# Patient Record
Sex: Female | Born: 1990 | Race: Black or African American | Hispanic: No | Marital: Single | Smoking: Former smoker
Health system: Southern US, Community
[De-identification: ages and names within clinical notes are randomized; demographics above are authoritative.]

## PROBLEM LIST (undated history)

## (undated) DIAGNOSIS — F419 Anxiety disorder, unspecified: Secondary | ICD-10-CM

## (undated) DIAGNOSIS — J45909 Unspecified asthma, uncomplicated: Secondary | ICD-10-CM

## (undated) DIAGNOSIS — O24419 Gestational diabetes mellitus in pregnancy, unspecified control: Secondary | ICD-10-CM

## (undated) DIAGNOSIS — F329 Major depressive disorder, single episode, unspecified: Secondary | ICD-10-CM

## (undated) DIAGNOSIS — O1490 Unspecified pre-eclampsia, unspecified trimester: Secondary | ICD-10-CM

## (undated) DIAGNOSIS — A5901 Trichomonal vulvovaginitis: Secondary | ICD-10-CM

## (undated) DIAGNOSIS — E669 Obesity, unspecified: Secondary | ICD-10-CM

## (undated) DIAGNOSIS — F32A Depression, unspecified: Secondary | ICD-10-CM

## (undated) HISTORY — DX: Trichomonal vulvovaginitis: A59.01

## (undated) HISTORY — DX: Gestational diabetes mellitus in pregnancy, unspecified control: O24.419

## (undated) HISTORY — PX: TONSILLECTOMY: SUR1361

---
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
## 2010-02-18 ENCOUNTER — Emergency Department (HOSPITAL_COMMUNITY): Admission: EM | Admit: 2010-02-18 | Discharge: 2010-02-18 | Payer: Self-pay | Admitting: Emergency Medicine

## 2010-04-04 ENCOUNTER — Emergency Department (HOSPITAL_COMMUNITY): Admission: EM | Admit: 2010-04-04 | Discharge: 2010-04-04 | Payer: Self-pay | Admitting: Emergency Medicine
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
## 2010-10-29 NOTE — Group Therapy Note (Signed)
Brooke Robertson, SALMINEN NO.:  [id number]   MEDICAL RECORD NO.:  [id number]          PATIENT TYPE:  INP   LOCATION:  0102                          FACILITY:  [facility]   PHYSICIAN:  Lalla Brothers, MDDATE OF BIRTH:  02-12-1991                                 PROGRESS NOTE   SERVICE CODE:  16109 of 25 minutes.   PRESENT ILLNESS:  The patient remains depressed and anxious, appearing to underlie her  resistant self-defeat to the overall treatment process.  Father is  visiting episodically and encouraging the patient to engage in a  trusting and therapeutic fashion.  Patient will state that she did not  sleep well last night and she feels dizzy.  She denies chest pain or  difficulty breathing today.  She has not had any overt panic attacks,  requiring staff intervention, with p.r.n. Ativan and she did not take  Ativan at bedtime to help her sleep.  In fact, she tells me not to give  her Ativan, but she has a hospital-dependent style, as she did about  starting the Zoloft that prompts to go ahead and set up bedtime Ativan  on a scheduled basis, even though the patient tells me not to.  No  definite injury from stabbing her chest with a screwdriver has evolved  and I cannot determine that the patient has additional self-injury since  admission.  Patient indicates to staff at times that she has no reason  to be here, instead of addressing her suicide risk and problems.  She  does allow me to explore the family structure in anticipated return to  step-mother's home when step-mother is not available.   MENTAL STATUS EXAM:  Blood pressure is 110/67 with heart rate of 66 supine and 110/72 with  heart rate of 110 standing.  Patient has no sore throat complaints now  and her strep screen was negative.  Patient has no abnormal involuntary  movements.  She has moderate anxiety and moderate to severe dysphoria.  However, she has titrated up to 1 mg/kg of Zoloft.  She has no overt  psychosis and no manic diathesis.  She is tense and inhibited, as well  as avoidant.   TARGET DIAGNOSES:  1. Major depression, single episode, severe.  2. Panic disorder, likely with agoraphobia.   PLAN:  Ativan is ordered at 0.5 mg q.h.s., as well as being available every 4  hours p.r.n. for panic.  She continues Zoloft 50 mg every morning.  Medication education is updated and psychotherapy is carried out and  integrated with the treatment milieu and groups.      Lalla Brothers, MD  Electronically Signed     GEJ/MEDQ  D:  11/12/2007  T:  11/12/2007  Job:  [id number]
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
## 2011-03-12 LAB — ETHANOL: Alcohol, Ethyl (B): <5

## 2011-03-12 LAB — GC/CHLAMYDIA PROBE AMP, URINE
Chlamydia, Swab/Urine, PCR: NEGATIVE
GC Probe Amp, Urine: NEGATIVE

## 2011-03-12 LAB — GAMMA GT: GGT: 25

## 2011-03-12 LAB — HEPATIC FUNCTION PANEL
Albumin: 4
Alkaline Phosphatase: 55
Indirect Bilirubin: 0.5
Total Protein: 7.5

## 2011-03-12 LAB — URINALYSIS, ROUTINE W REFLEX MICROSCOPIC
Glucose, UA: NEGATIVE
Ketones, ur: NEGATIVE
Nitrite: NEGATIVE
Specific Gravity, Urine: 1.025
pH: 7.5

## 2011-03-12 LAB — DIFFERENTIAL
Eosinophils Absolute: 0.2
Lymphocytes Relative: 40
Lymphs Abs: 2.4
Monocytes Relative: 7
Neutro Abs: 3.1
Neutrophils Relative %: 50

## 2011-03-12 LAB — POCT I-STAT, CHEM 8
BUN: 11
Calcium, Ion: 1.17
Chloride: 111
Creatinine, Ser: 0.9
Sodium: 144

## 2011-03-12 LAB — RAPID URINE DRUG SCREEN, HOSP PERFORMED
Cocaine: NOT DETECTED
Opiates: NOT DETECTED
Tetrahydrocannabinol: NOT DETECTED

## 2011-03-12 LAB — PREGNANCY, URINE: Preg Test, Ur: NEGATIVE

## 2011-03-12 LAB — RPR: RPR Ser Ql: NONREACTIVE

## 2011-03-12 LAB — CBC
Platelets: 275
RBC: 4.52
WBC: 6.1

## 2011-03-12 LAB — POCT PREGNANCY, URINE
Operator id: 196461
Preg Test, Ur: NEGATIVE

## 2011-03-12 LAB — RAPID STREP SCREEN (MED CTR MEBANE ONLY): Streptococcus, Group A Screen (Direct): NEGATIVE

## 2011-03-12 LAB — T4, FREE: Free T4: 0.98

## 2011-03-27 LAB — POCT RAPID STREP A: Streptococcus, Group A Screen (Direct): POSITIVE — AB

## 2011-12-16 ENCOUNTER — Encounter (HOSPITAL_BASED_OUTPATIENT_CLINIC_OR_DEPARTMENT_OTHER): Payer: Self-pay

## 2011-12-16 ENCOUNTER — Emergency Department (HOSPITAL_BASED_OUTPATIENT_CLINIC_OR_DEPARTMENT_OTHER): Payer: Self-pay

## 2011-12-16 ENCOUNTER — Emergency Department (HOSPITAL_BASED_OUTPATIENT_CLINIC_OR_DEPARTMENT_OTHER)
Admission: EM | Admit: 2011-12-16 | Discharge: 2011-12-16 | Disposition: A | Discharge status: Home / Self Care | Payer: Self-pay | Attending: Emergency Medicine | Admitting: Emergency Medicine

## 2011-12-16 DIAGNOSIS — R0609 Other forms of dyspnea: Secondary | ICD-10-CM | POA: Insufficient documentation

## 2011-12-16 DIAGNOSIS — R0989 Other specified symptoms and signs involving the circulatory and respiratory systems: Secondary | ICD-10-CM | POA: Insufficient documentation

## 2011-12-16 DIAGNOSIS — H1045 Other chronic allergic conjunctivitis: Secondary | ICD-10-CM | POA: Insufficient documentation

## 2011-12-16 DIAGNOSIS — R0600 Dyspnea, unspecified: Secondary | ICD-10-CM

## 2011-12-16 DIAGNOSIS — H1010 Acute atopic conjunctivitis, unspecified eye: Secondary | ICD-10-CM

## 2011-12-16 DIAGNOSIS — R51 Headache: Secondary | ICD-10-CM | POA: Insufficient documentation

## 2011-12-16 LAB — PREGNANCY, URINE: Preg Test, Ur: NEGATIVE

## 2011-12-16 MED ORDER — KETOTIFEN FUMARATE 0.025 % OP SOLN: 1.0000 [drp] | Freq: Two times a day (BID) | OPHTHALMIC | Status: AC

## 2011-12-16 NOTE — ED Notes (Signed)
Pt requesting preg test, denies abd pain , nausea or vaginal bleeding

## 2011-12-16 NOTE — ED Notes (Signed)
C/o HA, sob, "eyes blood shot" x 3-4 days

## 2011-12-16 NOTE — ED Provider Notes (Signed)
History     CSN: [id number]  Arrival date & time 12/16/11  1723   First MD Initiated Contact with Patient 12/16/11 1854      Chief Complaint  Patient presents with  . Headache    (Consider location/radiation/quality/duration/timing/severity/associated sxs/prior treatment) HPI This 21 year old female is PERC negative and complains of a few days of some mild shortness of breath without cough or chest pain or fever. She also is no trauma. She woke up today with a mild to moderate headache is global and without associated symptoms. She has some slightly itchy eyes with some slightly bloodshot eyes today but no purulent drainage in her eyes and no pain in her eyes. There is no change in vision. She is no change in speech vision swallowing or understanding. She also is no lateralizing or focal weakness numbness or incoordination. She is no abdominal pain vomiting diarrhea dysuria or vaginal bleeding or vaginal discharge. She however states she wants a pregnancy test because she had a regular menstrual bleeding a couple weeks ago chest 2 weeks after having her normal cycle a month ago. History reviewed. No pertinent past medical history.  Past Surgical History  Procedure Date  . Tonsillectomy     No family history on file.  History  Substance Use Topics  . Smoking status: Current Some Day Smoker  . Smokeless tobacco: Not on file  . Alcohol Use: Yes    OB History    Grav Para Term Preterm Abortions TAB SAB Ect Mult Living                  Review of Systems  Constitutional: Negative for fever.       10 Systems reviewed and are negative for acute change except as noted in the HPI.  HENT: Negative for congestion.   Eyes: Negative for discharge and redness.  Respiratory: Positive for shortness of breath. Negative for cough and stridor.   Cardiovascular: Negative for chest pain.  Gastrointestinal: Negative for vomiting and abdominal pain.  Musculoskeletal: Negative for back pain.    Skin: Negative for rash.  Neurological: Positive for headaches. Negative for dizziness, syncope, facial asymmetry, speech difficulty, weakness, light-headedness and numbness.  Psychiatric/Behavioral:       No behavior change.    Allergies  Review of patient's allergies indicates no known allergies.  Home Medications   Current Outpatient Rx  Name Route Sig Dispense Refill  . IBUPROFEN 200 MG PO TABS Oral Take 400 mg by mouth every 6 (six) hours as needed. Patient used this medication for pain.    Marland Kitchen KETOTIFEN FUMARATE 0.025 % OP SOLN Both Eyes Place 1 drop into both eyes 2 (two) times daily. 5 mL 0    BP 121/79  Pulse 70  Temp 98.5 F (36.9 C) (Oral)  Resp 16  Ht 5' (1.524 m)  Wt 178 lb (80.74 kg)  BMI 34.76 kg/m2  SpO2 100%  LMP 12/10/2011  Physical Exam  Nursing note and vitals reviewed. Constitutional:       Awake, alert, nontoxic appearance with baseline speech for patient.  HENT:  Head: Atraumatic.  Mouth/Throat: Oropharynx is clear and moist. No oropharyngeal exudate.  Eyes: EOM are normal. Pupils are equal, round, and reactive to light. Right eye exhibits no discharge. Left eye exhibits no discharge. No scleral icterus.       Minimally injected conjunctiva bilaterally with no discharge  Neck: Neck supple.  Cardiovascular: Normal rate and regular rhythm.   No murmur heard. Pulmonary/Chest: Effort normal  and breath sounds normal. No stridor. No respiratory distress. She has no wheezes. She has no rales. She exhibits no tenderness.  Abdominal: Soft. Bowel sounds are normal. She exhibits no mass. There is no tenderness. There is no rebound.  Musculoskeletal: She exhibits no tenderness.       Baseline ROM, moves extremities with no obvious new focal weakness.  Lymphadenopathy:    She has no cervical adenopathy.  Neurological: She is alert.       Awake, alert, cooperative and aware of situation; motor strength bilaterally; sensation normal to light touch bilaterally;  peripheral visual fields full to confrontation; no facial asymmetry; tongue midline; major cranial nerves appear intact; no pronator drift, normal finger to nose bilaterally, baseline gait without new ataxia.  Skin: No rash noted.  Psychiatric: She has a normal mood and affect.    ED Course  Procedures (including critical care time)   Labs Reviewed  PREGNANCY, URINE   No results found.   1. Headache   2. Allergic conjunctivitis   3. Dyspnea       MDM  Pt stable in ED with no significant deterioration in condition.Patient / Family / Caregiver informed of clinical course, understand medical decision-making process, and agree with plan. I doubt any other EMC precluding discharge at this time including, but not necessarily limited to the following:SAH, meningitis, CVA, PE.        Hurman Horn, MD 12/20/11 1332

## 2011-12-16 NOTE — ED Notes (Signed)
Patient transported to X-ray 

## 2011-12-16 NOTE — ED Notes (Signed)
Returned from xray

## 2011-12-16 NOTE — ED Notes (Signed)
MD at bedside. 

## 2012-03-08 ENCOUNTER — Encounter (HOSPITAL_COMMUNITY): Payer: Self-pay | Admitting: *Deleted

## 2012-03-08 ENCOUNTER — Emergency Department (HOSPITAL_COMMUNITY)
Admission: EM | Admit: 2012-03-08 | Discharge: 2012-03-08 | Disposition: A | Discharge status: Home / Self Care | Payer: No Typology Code available for payment source | Attending: Emergency Medicine | Admitting: Emergency Medicine

## 2012-03-08 ENCOUNTER — Emergency Department (HOSPITAL_COMMUNITY): Payer: Self-pay

## 2012-03-08 DIAGNOSIS — M545 Low back pain, unspecified: Secondary | ICD-10-CM | POA: Insufficient documentation

## 2012-03-08 DIAGNOSIS — M542 Cervicalgia: Secondary | ICD-10-CM | POA: Insufficient documentation

## 2012-03-08 LAB — POCT PREGNANCY, URINE: Preg Test, Ur: NEGATIVE

## 2012-03-08 MED ORDER — IBUPROFEN 600 MG PO TABS: 600.0000 mg | ORAL_TABLET | Freq: Three times a day (TID) | ORAL | Status: DC | PRN

## 2012-03-08 MED ORDER — IBUPROFEN 200 MG PO TABS
600.0000 mg | ORAL_TABLET | Freq: Once | ORAL | Status: AC
  Administered 2012-03-08: 600 mg via ORAL
  Filled 2012-03-08: qty 3

## 2012-03-08 NOTE — ED Notes (Signed)
Xray informed of pt's negative preg test.
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
## 2012-03-08 NOTE — ED Notes (Signed)
Pt returned from X-ray.  

## 2012-06-21 ENCOUNTER — Encounter (HOSPITAL_COMMUNITY): Payer: Self-pay | Admitting: *Deleted

## 2012-06-21 ENCOUNTER — Emergency Department (HOSPITAL_COMMUNITY)
Admission: EM | Admit: 2012-06-21 | Discharge: 2012-06-21 | Disposition: A | Discharge status: Home / Self Care | Payer: Medicaid Other | Attending: Emergency Medicine | Admitting: Emergency Medicine

## 2012-06-21 DIAGNOSIS — F17200 Nicotine dependence, unspecified, uncomplicated: Secondary | ICD-10-CM | POA: Insufficient documentation

## 2012-06-21 DIAGNOSIS — Z8659 Personal history of other mental and behavioral disorders: Secondary | ICD-10-CM | POA: Insufficient documentation

## 2012-06-21 DIAGNOSIS — Z3202 Encounter for pregnancy test, result negative: Secondary | ICD-10-CM | POA: Insufficient documentation

## 2012-06-21 DIAGNOSIS — N390 Urinary tract infection, site not specified: Secondary | ICD-10-CM | POA: Insufficient documentation

## 2012-06-21 DIAGNOSIS — A749 Chlamydial infection, unspecified: Secondary | ICD-10-CM | POA: Insufficient documentation

## 2012-06-21 DIAGNOSIS — J45909 Unspecified asthma, uncomplicated: Secondary | ICD-10-CM | POA: Insufficient documentation

## 2012-06-21 DIAGNOSIS — A599 Trichomoniasis, unspecified: Secondary | ICD-10-CM | POA: Insufficient documentation

## 2012-06-21 DIAGNOSIS — O4690 Antepartum hemorrhage, unspecified, unspecified trimester: Secondary | ICD-10-CM | POA: Insufficient documentation

## 2012-06-21 DIAGNOSIS — O039 Complete or unspecified spontaneous abortion without complication: Secondary | ICD-10-CM | POA: Insufficient documentation

## 2012-06-21 HISTORY — DX: Anxiety disorder, unspecified: F41.9

## 2012-06-21 HISTORY — DX: Unspecified asthma, uncomplicated: J45.909

## 2012-06-21 HISTORY — DX: Depression, unspecified: F32.A

## 2012-06-21 HISTORY — DX: Major depressive disorder, single episode, unspecified: F32.9

## 2012-06-21 LAB — URINALYSIS, ROUTINE W REFLEX MICROSCOPIC
Bilirubin Urine: NEGATIVE
Glucose, UA: NEGATIVE mg/dL
Ketones, ur: NEGATIVE mg/dL
Nitrite: POSITIVE — AB
Protein, ur: NEGATIVE mg/dL
Specific Gravity, Urine: 1.03 (ref 1.005–1.030)
Urobilinogen, UA: 1 mg/dL (ref 0.0–1.0)
pH: 6 (ref 5.0–8.0)

## 2012-06-21 LAB — WET PREP, GENITAL: Clue Cells Wet Prep HPF POC: NONE SEEN

## 2012-06-21 LAB — HCG, QUANTITATIVE, PREGNANCY: hCG, Beta Chain, Quant, S: <1 m[IU]/mL (ref <5)

## 2012-06-21 LAB — URINE MICROSCOPIC-ADD ON

## 2012-06-21 LAB — PREGNANCY, URINE: Preg Test, Ur: NEGATIVE

## 2012-06-21 MED ORDER — NITROFURANTOIN MONOHYD MACRO 100 MG PO CAPS: 100.0000 mg | ORAL_CAPSULE | Freq: Two times a day (BID) | ORAL | Status: DC

## 2012-06-21 MED ORDER — METRONIDAZOLE 500 MG PO TABS: 500.0000 mg | ORAL_TABLET | Freq: Two times a day (BID) | ORAL | Status: DC

## 2012-06-21 NOTE — ED Notes (Signed)
NWG:[id number]<ZH> Expected date:06/21/12<BR> Expected time: 9:42 AM<BR> Means of arrival:Ambulance<BR> Comments:<BR> 21yoF/abd pain

## 2012-06-21 NOTE — ED Provider Notes (Signed)
History     CSN: [id number]  Arrival date & time 06/21/12  6578   First MD Initiated Contact with Patient 06/21/12 1012      Chief Complaint  Patient presents with  . Abdominal Pain    (Consider location/radiation/quality/duration/timing/severity/associated sxs/prior treatment) HPI Comments: Patient presents today with a chief complaint of abdominal pain.  Pain is diffuse.  She describes the pain as a cramping pain.  She reports that the pain has been intermittent over the past week.  She states that she took a pregnancy test around Christmas, which was positive.  She has been having a small amount of vaginal bleeding over the past week.  She reports that she only notices the blood when she wipes.  No vaginal bleeding at this time.  She states that she took a pregnancy test yesterday, which was negative.  She has never been pregnant before.  She denies any vaginal discharge.  No nausea or vomiting.    Patient is a 22 y.o. female presenting with abdominal pain. The history is provided by the patient.  Abdominal Pain The primary symptoms of the illness include abdominal pain and vaginal bleeding. The primary symptoms of the illness do not include fever, nausea, vomiting, dysuria or vaginal discharge.  Symptoms associated with the illness do not include chills, urgency or frequency.    Past Medical History  Diagnosis Date  . Asthma   . Anxiety   . Depression     Past Surgical History  Procedure Date  . Tonsillectomy     History reviewed. No pertinent family history.  History  Substance Use Topics  . Smoking status: Current Some Day Smoker    Types: Cigarettes  . Smokeless tobacco: Never Used  . Alcohol Use: Yes     Comment: every other day    OB History    Grav Para Term Preterm Abortions TAB SAB Ect Mult Living                  Review of Systems  Constitutional: Negative for fever and chills.  Gastrointestinal: Positive for abdominal pain. Negative for nausea and  vomiting.  Genitourinary: Positive for vaginal bleeding. Negative for dysuria, urgency, frequency, vaginal discharge and pelvic pain.  All other systems reviewed and are negative.    Allergies  Review of patient's allergies indicates no known allergies.  Home Medications   Current Outpatient Rx  Name  Route  Sig  Dispense  Refill  . ACETAMINOPHEN 325 MG PO TABS   Oral   Take 650 mg by mouth every 6 (six) hours as needed. For headache         . IBUPROFEN 200 MG PO TABS   Oral   Take 200 mg by mouth every 6 (six) hours as needed. For pain.           BP 111/56  Pulse 68  Temp 98.9 F (37.2 C) (Oral)  Resp 16  SpO2 98%  LMP 06/04/2012  Physical Exam  Nursing note and vitals reviewed. Constitutional: She appears well-developed and well-nourished. No distress.  HENT:  Head: Normocephalic and atraumatic.  Mouth/Throat: Oropharynx is clear and moist.  Neck: Normal range of motion. Neck supple.  Cardiovascular: Normal rate, regular rhythm and normal heart sounds.   Pulmonary/Chest: Effort normal and breath sounds normal.  Abdominal: Soft. Bowel sounds are normal. She exhibits no distension and no mass. There is no tenderness. There is no rebound and no guarding.  Genitourinary: Uterus normal. Cervix exhibits no motion  tenderness and no discharge. Right adnexum displays no mass, no tenderness and no fullness. Left adnexum displays no mass, no tenderness and no fullness.       Cervical os closed  Musculoskeletal: Normal range of motion.  Neurological: She is alert.  Skin: Skin is warm and dry. She is not diaphoretic.  Psychiatric: She has a normal mood and affect.    ED Course  Procedures (including critical care time)   Labs Reviewed  PREGNANCY, URINE  URINALYSIS, ROUTINE W REFLEX MICROSCOPIC  WET PREP, GENITAL  GC/CHLAMYDIA PROBE AMP  HCG, QUANTITATIVE, PREGNANCY  ABO/RH   No results found.   No diagnosis found.    MDM  Patient presents with  intermittent abdominal cramping and vaginal bleeding.  She reports that she took a Pregnancy test a couple of weeks ago that was positive.  She has had some vaginal bleeding.  Today her pregnancy test was negative.  Her cervical os was closed on pelvic exam.  Beta Hcg level was less than 1.  Patient hemodynamically stable.  Feel that patient can be discharged home.  Patient treated for BV and UTI.  Patient given referral to Gynecology.  Patient discussed with Dr. Adriana Simas.        Pascal Lux Whittingham, PA-C 06/23/12 1223

## 2012-06-21 NOTE — ED Notes (Signed)
Per EMS pt having abdominal pain w/ vaginal bleeding, pink spotting when wiping, around christmas she took a pregnancy test d/t missing her period that was suppose to come 12/20 and it was positive, took one yesterday and it was negative. Abdominal pain 6/10. BP 110/82, HR 56, RR 16, 98% RA.

## 2012-06-23 LAB — GC/CHLAMYDIA PROBE AMP
CT Probe RNA: POSITIVE — AB
GC Probe RNA: NEGATIVE

## 2012-06-24 NOTE — ED Notes (Signed)
+  Chlamydia. Chart sent to EDP office for review. [facility]HS attached. 

## 2012-06-26 ENCOUNTER — Telehealth (HOSPITAL_COMMUNITY): Payer: Self-pay | Admitting: Emergency Medicine

## 2012-06-26 NOTE — ED Notes (Signed)
Chart returned from EDP office. Prescribed Azithromycin 1 gram PO X 1. #1. No refills. Prescribed by Dahlia Client Muthersbaugh PA-C.

## 2012-06-26 NOTE — ED Provider Notes (Signed)
Medical screening examination/treatment/procedure(s) were conducted as a shared visit with non-physician practitioner(s) and myself.  I personally evaluated the patient during the encounter.  Pregnancy test negative. No acute abdomen. Will Rx for BV and UTI  Donnetta Hutching, MD 06/26/12 [phone]

## 2012-06-27 NOTE — ED Notes (Signed)
Unable to contact patient via phone. Sent letter. °

## 2012-07-06 ENCOUNTER — Encounter (HOSPITAL_COMMUNITY): Payer: Self-pay | Admitting: Obstetrics and Gynecology

## 2012-07-06 ENCOUNTER — Inpatient Hospital Stay (HOSPITAL_COMMUNITY)
Admission: AD | Admit: 2012-07-06 | Discharge: 2012-07-06 | Disposition: A | Discharge status: Home / Self Care | Payer: Medicaid Other | Source: Ambulatory Visit | Attending: Obstetrics and Gynecology | Admitting: Obstetrics and Gynecology

## 2012-07-06 DIAGNOSIS — L293 Anogenital pruritus, unspecified: Secondary | ICD-10-CM | POA: Insufficient documentation

## 2012-07-06 DIAGNOSIS — A599 Trichomoniasis, unspecified: Secondary | ICD-10-CM

## 2012-07-06 DIAGNOSIS — N949 Unspecified condition associated with female genital organs and menstrual cycle: Secondary | ICD-10-CM | POA: Insufficient documentation

## 2012-07-06 DIAGNOSIS — A5609 Other chlamydial infection of lower genitourinary tract: Secondary | ICD-10-CM | POA: Insufficient documentation

## 2012-07-06 DIAGNOSIS — N72 Inflammatory disease of cervix uteri: Secondary | ICD-10-CM | POA: Insufficient documentation

## 2012-07-06 DIAGNOSIS — A5901 Trichomonal vulvovaginitis: Secondary | ICD-10-CM | POA: Insufficient documentation

## 2012-07-06 MED ORDER — METRONIDAZOLE 500 MG PO TABS
2000.0000 mg | ORAL_TABLET | Freq: Once | ORAL | Status: AC
  Administered 2012-07-06: 2000 mg via ORAL
  Filled 2012-07-06: qty 1

## 2012-07-06 MED ORDER — CEFTRIAXONE SODIUM 250 MG IJ SOLR
250.0000 mg | Freq: Once | INTRAMUSCULAR | Status: AC
  Administered 2012-07-06: 250 mg via INTRAMUSCULAR
  Filled 2012-07-06: qty 250

## 2012-07-06 MED ORDER — AZITHROMYCIN 1 G PO PACK
1.0000 g | PACK | Freq: Once | ORAL | Status: DC
  Filled 2012-07-06: qty 1

## 2012-07-06 MED ORDER — AZITHROMYCIN 250 MG PO TABS
1000.0000 mg | ORAL_TABLET | Freq: Once | ORAL | Status: AC
  Administered 2012-07-06: 1000 mg via ORAL
  Filled 2012-07-06: qty 4

## 2012-07-06 NOTE — MAU Note (Signed)
Patient states she had had a positive home pregnancy and went to [facility] a couple of days later and BHCG <1 and was told she had had a SAB. Patient had not had a documented positive pregnancy test. Patient was tested positive for trich and CT and was given Rx for medication but did not get the Rx filled and has not taken. States she continues to have vaginal irritation and slight bleeding.

## 2012-07-06 NOTE — MAU Note (Signed)
Patient came in via EMS because she is having "burning itching and irritation" pt states she was seen at

## 2012-07-06 NOTE — ED Notes (Signed)
S;  22 yo nulligravida presents to [facility] MAU with worsening vaginal pruritus. No fever/chills or abdominal pain. She was seen 06/22/2011 at [facility] long ED and diagnosed by wet prep with trichomonas and by cervical culture with Chlamydia. She did not fill the prescriptions because she could not afford it and still does not have [facility]. She informed the partner but does not know if he got treated.   O: Filed Vitals:   07/06/12 1711  BP: 125/76  Pulse: 76  Temp: 98.3 F (36.8 C)  Resp: 16  Gen.: Obese female in NAD Abdomen: soft nontender Pelvic: deferred  A: Trich vaginitis Chlamydia Needs contraception  P:  Flagyl 2Gm po and Zithromax 1 GM po given  AVS safe sex and contraceptive choices F/U at [facility] GYN - note sent to call her for apponitment

## 2012-07-16 ENCOUNTER — Encounter: Payer: Medicaid Other | Admitting: Medical

## 2012-07-23 ENCOUNTER — Encounter: Payer: Medicaid Other | Admitting: Medical

## 2012-07-25 ENCOUNTER — Telehealth (HOSPITAL_COMMUNITY): Payer: Self-pay | Admitting: Emergency Medicine

## 2012-07-25 NOTE — ED Notes (Signed)
No response to letter sent after 30 days. Chart sent to Medical Records. °

## 2015-08-23 ENCOUNTER — Encounter (HOSPITAL_COMMUNITY): Payer: Self-pay

## 2015-08-23 ENCOUNTER — Emergency Department (HOSPITAL_COMMUNITY)
Admission: EM | Admit: 2015-08-23 | Discharge: 2015-08-23 | Disposition: A | Discharge status: Home / Self Care | Payer: No Typology Code available for payment source | Attending: Emergency Medicine | Admitting: Emergency Medicine

## 2015-08-23 DIAGNOSIS — Y998 Other external cause status: Secondary | ICD-10-CM | POA: Insufficient documentation

## 2015-08-23 DIAGNOSIS — Z8659 Personal history of other mental and behavioral disorders: Secondary | ICD-10-CM | POA: Insufficient documentation

## 2015-08-23 DIAGNOSIS — F17210 Nicotine dependence, cigarettes, uncomplicated: Secondary | ICD-10-CM | POA: Insufficient documentation

## 2015-08-23 DIAGNOSIS — J45909 Unspecified asthma, uncomplicated: Secondary | ICD-10-CM | POA: Insufficient documentation

## 2015-08-23 DIAGNOSIS — R1110 Vomiting, unspecified: Secondary | ICD-10-CM | POA: Insufficient documentation

## 2015-08-23 DIAGNOSIS — X58XXXA Exposure to other specified factors, initial encounter: Secondary | ICD-10-CM | POA: Insufficient documentation

## 2015-08-23 DIAGNOSIS — Y9389 Activity, other specified: Secondary | ICD-10-CM | POA: Insufficient documentation

## 2015-08-23 DIAGNOSIS — K5900 Constipation, unspecified: Secondary | ICD-10-CM

## 2015-08-23 DIAGNOSIS — K921 Melena: Secondary | ICD-10-CM | POA: Insufficient documentation

## 2015-08-23 DIAGNOSIS — S31809A Unspecified open wound of unspecified buttock, initial encounter: Secondary | ICD-10-CM | POA: Insufficient documentation

## 2015-08-23 DIAGNOSIS — Y9289 Other specified places as the place of occurrence of the external cause: Secondary | ICD-10-CM | POA: Insufficient documentation

## 2015-08-23 MED ORDER — POLYETHYLENE GLYCOL 3350 17 GM/SCOOP PO POWD: 1.0000 | Freq: Once | ORAL | Status: DC

## 2015-08-23 NOTE — Discharge Instructions (Signed)

## 2015-08-23 NOTE — ED Provider Notes (Signed)
CSN: [id number]     Arrival date & time 08/23/15  2156 History   First MD Initiated Contact with Patient 08/23/15 2220     Chief Complaint  Patient presents with  . Hardened Stools      (Consider location/radiation/quality/duration/timing/severity/associated sxs/prior Treatment) HPI Comments: [age] year female presents with constipation. She states she has been having a bowel movement every 3-4 days for the past month. She reports associated straining, hard stools, incomplete evacuation, pain during and after BMs. She has tried epsom salt soaks and OTC laxatives with no relief. She states she has been in the hospital and has been relatively inactive since she is caring for her 524 month old who is in the NICU. She has been drinking plenty of fluid. Denies abdominal pain and nausea but has had 1 episode of vomiting after eating a couple weeks ago. Able to pass flatus. She reports she came to the ED tonight because she was in the NICU and someone told her to get checked out.  The history is provided by the patient.    Past Medical History  Diagnosis Date  . Asthma   . Anxiety   . Depression    Past Surgical History  Procedure Laterality Date  . Tonsillectomy     Family History  Problem Relation Age of Onset  . Diabetes Mother   . Depression Father   . Hypertension Father   . Anxiety disorder Father    Social History  Substance Use Topics  . Smoking status: Current Some Day Smoker    Types: Cigarettes  . Smokeless tobacco: Never Used  . Alcohol Use: Yes     Comment: every other day   OB History    Gravida Para Term Preterm AB TAB SAB Ectopic Multiple Living   0              Review of Systems  Constitutional: Positive for activity change. Negative for appetite change.       Decreased activity  Gastrointestinal: Positive for vomiting, constipation, blood in stool and rectal pain. Negative for nausea, abdominal pain, diarrhea, abdominal distention and anal bleeding.       Allergies  Review of patient's allergies indicates no known allergies.  Home Medications   Prior to Admission medications   Medication Sig Start Date End Date Taking? Authorizing Provider  polyethylene glycol powder (GLYCOLAX) powder Take 255 g by mouth once. 08/23/15   Bethel BornKelly Marie Gekas, PA-C   BP 125/87 mmHg  Pulse 95  Temp(Src) 98.7 F (37.1 C) (Oral)  Resp 16  Wt 94.212 kg  SpO2 98% Physical Exam  Constitutional: She is oriented to person, place, and time. She appears well-developed and well-nourished. No distress.  HENT:  Head: Normocephalic and atraumatic.  Eyes: Pupils are equal, round, and reactive to light.  Abdominal: Soft. Normal appearance and bowel sounds are normal. She exhibits no distension. There is no tenderness. There is no rebound and no guarding.  C-section scar noted  Genitourinary: Rectal exam shows tenderness. Rectal exam shows no external hemorrhoid, no internal hemorrhoid, no fissure, no mass and anal tone normal. Guaiac negative stool.  Linear open wound noted along intergluteal cleft  Neurological: She is alert and oriented to person, place, and time.  Psychiatric: She has a normal mood and affect.    ED Course  Procedures (including critical care time)  Rectal exam performed. Chaperone present. Pt was draped and prepped. Lubrication was used. Stool was guaiac neg. No stool in rectal vault was  felt.  MDM   Final diagnoses:  Constipation, unspecified constipation type   Due to lack of nausea, recent vomiting, ability to pass flatus, non-distended or tender abdomen, and lack of activity it is likely the patient has mild constipation. Prescribed miralax and advised to try a fleet enema if she does not have relief from the laxative. Advised to return to the ED if she has sudden onset of N/V, abdominal pain and inability to pass flatus.  Bethel Born, PA-C 08/24/15 1151  Zadie Rhine, MD 08/24/15 [phone]

## 2015-08-23 NOTE — ED Notes (Signed)
Patient able to ambulate independently  

## 2015-08-23 NOTE — ED Notes (Signed)
Pt reports for the past few weeks she has had painful bowel movements and described her stool as being very "hard" and has to strain to have a BM. Last Bm today. She also reports she has noticed some blood as well in her stool.

## 2016-01-30 ENCOUNTER — Encounter (HOSPITAL_COMMUNITY): Payer: Self-pay | Admitting: Emergency Medicine

## 2016-01-30 DIAGNOSIS — F17210 Nicotine dependence, cigarettes, uncomplicated: Secondary | ICD-10-CM | POA: Insufficient documentation

## 2016-01-30 DIAGNOSIS — J45909 Unspecified asthma, uncomplicated: Secondary | ICD-10-CM | POA: Insufficient documentation

## 2016-01-30 DIAGNOSIS — R194 Change in bowel habit: Secondary | ICD-10-CM | POA: Insufficient documentation

## 2016-01-30 LAB — URINALYSIS, ROUTINE W REFLEX MICROSCOPIC
BILIRUBIN URINE: NEGATIVE
Glucose, UA: NEGATIVE mg/dL
HGB URINE DIPSTICK: NEGATIVE
Ketones, ur: 15 mg/dL — AB
Leukocytes, UA: NEGATIVE
Nitrite: NEGATIVE
PROTEIN: 30 mg/dL — AB
SPECIFIC GRAVITY, URINE: 1.028 (ref 1.005–1.030)
pH: 6.5 (ref 5.0–8.0)

## 2016-01-30 LAB — CBC WITH DIFFERENTIAL/PLATELET
Basophils Absolute: 0 10*3/uL (ref 0.0–0.1)
Basophils Relative: 0 %
EOS ABS: 0.1 10*3/uL (ref 0.0–0.7)
EOS PCT: 1 %
HCT: 41.3 % (ref 36.0–46.0)
Hemoglobin: 13.5 g/dL (ref 12.0–15.0)
LYMPHS ABS: 2.4 10*3/uL (ref 0.7–4.0)
Lymphocytes Relative: 26 %
MCH: 29.4 pg (ref 26.0–34.0)
MCHC: 32.7 g/dL (ref 30.0–36.0)
MCV: 90 fL (ref 78.0–100.0)
MONO ABS: 0.6 10*3/uL (ref 0.1–1.0)
Monocytes Relative: 7 %
Neutro Abs: 5.9 10*3/uL (ref 1.7–7.7)
Neutrophils Relative %: 66 %
PLATELETS: 350 10*3/uL (ref 150–400)
RBC: 4.59 MIL/uL (ref 3.87–5.11)
RDW: 13.1 % (ref 11.5–15.5)
WBC: 9 10*3/uL (ref 4.0–10.5)

## 2016-01-30 LAB — COMPREHENSIVE METABOLIC PANEL
ALT: 13 U/L — AB (ref 14–54)
ANION GAP: 6 (ref 5–15)
AST: 18 U/L (ref 15–41)
Albumin: 3.7 g/dL (ref 3.5–5.0)
Alkaline Phosphatase: 44 U/L (ref 38–126)
BUN: 9 mg/dL (ref 6–20)
CHLORIDE: 110 mmol/L (ref 101–111)
CO2: 25 mmol/L (ref 22–32)
CREATININE: 0.85 mg/dL (ref 0.44–1.00)
Calcium: 9.3 mg/dL (ref 8.9–10.3)
GFR calc non Af Amer: >60 mL/min (ref >60)
Glucose, Bld: 99 mg/dL (ref 65–99)
POTASSIUM: 4 mmol/L (ref 3.5–5.1)
SODIUM: 141 mmol/L (ref 135–145)
TOTAL PROTEIN: 7 g/dL (ref 6.5–8.1)
Total Bilirubin: 0.4 mg/dL (ref 0.3–1.2)

## 2016-01-30 LAB — URINE MICROSCOPIC-ADD ON: RBC / HPF: NONE SEEN RBC/hpf (ref 0–5)

## 2016-01-30 LAB — POC URINE PREG, ED: PREG TEST UR: NEGATIVE

## 2016-01-30 NOTE — ED Triage Notes (Signed)
Pt. reports chronic constipation for several months worse these past several days with generalized abdominal discomfort , denies emesis /no fever or diarrhea .

## 2016-01-31 ENCOUNTER — Emergency Department (HOSPITAL_COMMUNITY)
Admission: EM | Admit: 2016-01-31 | Discharge: 2016-01-31 | Disposition: A | Discharge status: Home / Self Care | Payer: Medicaid Other | Attending: Emergency Medicine | Admitting: Emergency Medicine

## 2016-01-31 DIAGNOSIS — R194 Change in bowel habit: Secondary | ICD-10-CM

## 2016-01-31 HISTORY — DX: Obesity, unspecified: E66.9

## 2016-01-31 MED ORDER — CEPHALEXIN 500 MG PO CAPS: 500.0000 mg | ORAL_CAPSULE | Freq: Two times a day (BID) | ORAL | 0 refills | Status: DC

## 2016-01-31 MED ORDER — DOCUSATE SODIUM 100 MG PO CAPS: 100.0000 mg | ORAL_CAPSULE | Freq: Two times a day (BID) | ORAL | 0 refills | Status: DC

## 2016-01-31 NOTE — ED Provider Notes (Signed)
[facility]-EMERGENCY DEPT Provider Note   CSN: [id number] Arrival date & time: 01/30/16  1851     History   Chief Complaint Chief Complaint  Patient presents with  . Constipation    HPI Brooke Robertson is a 25 y.o. female.  The history is provided by the patient and medical records.  Constipation      25 year old female with history of anxiety, asthma, depression, obesity, presenting to the ED for constipation. Patient reports this is been ongoing issue for several months. She states she has the urge to have a bowel movement, however has difficulty doing so. States she spoke with her child's pediatrician about this and they recommended MiraLAX which she has taken. States she has to drink a large amount of this in order to produce a bowel movement, and afterward she still feels that her stomach is "full". She denies any nausea or vomiting. No fever or chills. She is able to pass flatus when needed. Did have a BM earlier today that was small.  She admits that her diet is very poor, she eats a large amount of fast food and drinks very little water. She denies any difficulty urinating. States she does have some mild dysuria which is also been somewhat chronic. She denies any pelvic pain or vaginal discharge. Her menstrual cycles have been regular.  Past Medical History:  Diagnosis Date  . Anxiety   . Asthma   . Depression   . Obesity     There are no active problems to display for this patient.   Past Surgical History:  Procedure Laterality Date  . TONSILLECTOMY      OB History    Gravida Para Term Preterm AB Living   0             SAB TAB Ectopic Multiple Live Births                   Home Medications    Prior to Admission medications   Medication Sig Start Date End Date Taking? Authorizing Provider  polyethylene glycol powder (GLYCOLAX) powder Take 255 g by mouth once. 08/23/15   Bethel BornKelly Marie Gekas, PA-C    Family History Family History  Problem Relation Age of Onset  .  Diabetes Mother   . Depression Father   . Hypertension Father   . Anxiety disorder Father     Social History Social History  Substance Use Topics  . Smoking status: Current Some Day Smoker    Types: Cigarettes  . Smokeless tobacco: Never Used  . Alcohol use Yes     Comment: every other day     Allergies   Review of patient's allergies indicates no known allergies.   Review of Systems Review of Systems  Gastrointestinal: Positive for constipation.  All other systems reviewed and are negative.    Physical Exam Updated Vital Signs BP 109/61 (BP Location: Right Arm)   Pulse 66   Temp 98.8 F (37.1 C) (Oral)   Resp 12   Ht 4\' 11"  (1.499 m)   Wt 98.9 kg   LMP 01/16/2016 (Approximate)   SpO2 100%   BMI 44.03 kg/m   Physical Exam  Constitutional: She is oriented to person, place, and time. She appears well-developed and well-nourished.  HENT:  Head: Normocephalic and atraumatic.  Mouth/Throat: Oropharynx is clear and moist.  Eyes: Conjunctivae and EOM are normal. Pupils are equal, round, and reactive to light.  Neck: Normal range of motion.  Cardiovascular: Normal rate, regular  rhythm and normal heart sounds.   Pulmonary/Chest: Effort normal and breath sounds normal.  Abdominal: Soft. Bowel sounds are normal. There is no tenderness. There is no rigidity and no guarding.  Abdomen soft, nondistended, no tenderness or peritoneal signs, normal bowel sounds  Musculoskeletal: Normal range of motion.  Neurological: She is alert and oriented to person, place, and time.  Skin: Skin is warm and dry.  Psychiatric: She has a normal mood and affect.  Nursing note and vitals reviewed.    ED Treatments / Results  Labs (all labs ordered are listed, but only abnormal results are displayed) Labs Reviewed  URINALYSIS, ROUTINE W REFLEX MICROSCOPIC (NOT AT [facility]) - Abnormal; Notable for the following:       Result Value   APPearance HAZY (*)    Ketones, ur 15 (*)    Protein, ur  30 (*)    All other components within normal limits  COMPREHENSIVE METABOLIC PANEL - Abnormal; Notable for the following:    ALT 13 (*)    All other components within normal limits  URINE MICROSCOPIC-ADD ON - Abnormal; Notable for the following:    Squamous Epithelial / LPF 6-30 (*)    Bacteria, UA MANY (*)    Casts GRANULAR CAST (*)    All other components within normal limits  CBC WITH DIFFERENTIAL/PLATELET  POC URINE PREG, ED    EKG  EKG Interpretation None       Radiology No results found.  Procedures Procedures (including critical care time)  Medications Ordered in ED Medications - No data to display   Initial Impression / Assessment and Plan / ED Course  I have reviewed the triage vital signs and the nursing notes.  Pertinent labs & imaging results that were available during my care of the patient were reviewed by me and considered in my medical decision making (see chart for details).  Clinical Course   25 year old female here with constipation over the past few months. She is afebrile and nontoxic. Her abdominal exam is benign. She's not had any nausea or vomiting is able to freely pass flatus. She did have a bowel movement today that was small. Her labs are reassuring.  U/a does appear infected.  Will treat with abx given her mild dysuria.  Clinically do not suspect bowel obstruction. I suspect that her constipation is in part due to her poor diet and lack of water intake. Have recommended to increase fiber intake and drink more water. She has been using MiraLAX with little improvement, recommend she try an over-the-counter stool softener such as Colace or Dulcolax. She does not currently have a primary care doctor, have referred her to the wellness clinic for follow-up.  Discussed plan with patient, she acknowledged understanding and agreed with plan of care.  Return precautions given for new or worsening symptoms.  Final Clinical Impressions(s) / ED Diagnoses    Final diagnoses:  Decreased frequency of bowel movements    New Prescriptions Discharge Medication List as of 01/31/2016  3:30 AM    START taking these medications   Details  cephALEXin (KEFLEX) 500 MG capsule Take 1 capsule (500 mg total) by mouth 2 (two) times daily., Starting Thu 01/31/2016, Print    docusate sodium (COLACE) 100 MG capsule Take 1 capsule (100 mg total) by mouth every 12 (twelve) hours., Starting Thu 01/31/2016, Print         Garlon HatchetLisa M Jermiyah Ricotta, PA-C 01/31/16 [id number]    Shon Batonourtney F Horton, MD 01/31/16 [id number]

## 2016-01-31 NOTE — Discharge Instructions (Signed)
Take the prescribed medication as directed. As we discussed, recommend to increase your vegetable and fiber intake as well as drinking more water. Follow-up with the wellness clinic, call to make an appointment. Return to the ED for new or worsening symptoms.

## 2017-05-12 ENCOUNTER — Emergency Department (HOSPITAL_COMMUNITY): Payer: Self-pay

## 2017-05-12 ENCOUNTER — Encounter (HOSPITAL_COMMUNITY): Payer: Self-pay

## 2017-05-12 ENCOUNTER — Emergency Department (HOSPITAL_COMMUNITY)
Admission: EM | Admit: 2017-05-12 | Discharge: 2017-05-12 | Disposition: A | Discharge status: Home / Self Care | Payer: Self-pay | Attending: Emergency Medicine | Admitting: Emergency Medicine

## 2017-05-12 DIAGNOSIS — F17210 Nicotine dependence, cigarettes, uncomplicated: Secondary | ICD-10-CM | POA: Insufficient documentation

## 2017-05-12 DIAGNOSIS — J45909 Unspecified asthma, uncomplicated: Secondary | ICD-10-CM | POA: Insufficient documentation

## 2017-05-12 DIAGNOSIS — Z79899 Other long term (current) drug therapy: Secondary | ICD-10-CM | POA: Insufficient documentation

## 2017-05-12 DIAGNOSIS — R0789 Other chest pain: Secondary | ICD-10-CM | POA: Insufficient documentation

## 2017-05-12 LAB — CBG MONITORING, ED: GLUCOSE-CAPILLARY: 83 mg/dL (ref 65–99)

## 2017-05-12 MED ORDER — IBUPROFEN 600 MG PO TABS: 600.0000 mg | ORAL_TABLET | Freq: Four times a day (QID) | ORAL | 0 refills | Status: DC | PRN

## 2017-05-12 NOTE — Discharge Instructions (Signed)
Return if any problems.

## 2017-05-12 NOTE — ED Triage Notes (Signed)
Patient complains of frontal headache x 2-3 days also reports that she developed cough this am. Has chest wall pain with inspiration and movement. NAD

## 2017-05-12 NOTE — ED Notes (Signed)
Pt CBG was 83, notified Jamie(RN)

## 2017-05-12 NOTE — ED Provider Notes (Signed)
[facility]   CSN: [id number] Arrival date & time: 05/12/17  [id number]     History   Chief Complaint Chief Complaint  Patient presents with  . COUGH/headache    HPI Brooke Robertson is a 26 y.o. female.  The history is provided by the patient. No language interpreter was used.  Cough  This is a new problem. The current episode started 2 days ago. The problem occurs constantly. The problem has been gradually worsening. The cough is non-productive. There has been no fever. Associated symptoms include chest pain. She has tried nothing for the symptoms. The treatment provided no relief. Her past medical history does not include bronchitis.   Pt complains of soreness in her chest.  Pt reports soreness with movement.  Pt reports congestion and headache for the past 2 days.   Past Medical History:  Diagnosis Date  . Anxiety   . Asthma   . Depression   . Obesity     There are no active problems to display for this patient.   Past Surgical History:  Procedure Laterality Date  . TONSILLECTOMY      OB History    Gravida Para Term Preterm AB Living   0             SAB TAB Ectopic Multiple Live Births                   Home Medications    Prior to Admission medications   Medication Sig Start Date End Date Taking? Authorizing Provider  cephALEXin (KEFLEX) 500 MG capsule Take 1 capsule (500 mg total) by mouth 2 (two) times daily. 01/31/16   Garlon HatchetSanders, Lisa M, PA-C  docusate sodium (COLACE) 100 MG capsule Take 1 capsule (100 mg total) by mouth every 12 (twelve) hours. 01/31/16   Garlon HatchetSanders, Lisa M, PA-C  ibuprofen (ADVIL,MOTRIN) 600 MG tablet Take 1 tablet (600 mg total) by mouth every 6 (six) hours as needed. 05/12/17   Elson AreasSofia, Leslie K, PA-C  polyethylene glycol powder (GLYCOLAX) powder Take 255 g by mouth once. 08/23/15   Bethel BornGekas, Kelly Marie, PA-C    Family History Family History  Problem Relation Age of Onset  . Diabetes Mother   .  Depression Father   . Hypertension Father   . Anxiety disorder Father     Social History Social History   Tobacco Use  . Smoking status: Current Some Day Smoker    Types: Cigarettes  . Smokeless tobacco: Never Used  Substance Use Topics  . Alcohol use: Yes    Comment: every other day  . Drug use: Yes    Types: Marijuana    Comment: socially     Allergies   Patient has no known allergies.   Review of Systems Review of Systems  Respiratory: Positive for cough.   Cardiovascular: Positive for chest pain.  All other systems reviewed and are negative.    Physical Exam Updated Vital Signs BP 113/63 (BP Location: Right Arm)   Pulse 70   Temp 98.2 F (36.8 C) (Oral)   Resp 16   Ht 5' (1.524 m)   Wt 90.7 kg (200 lb)   LMP 03/31/2017   SpO2 99%   BMI 39.06 kg/m   Physical Exam  Constitutional: She is oriented to person, place, and time. She appears well-developed and well-nourished.  HENT:  Head: Normocephalic.  Eyes: EOM are normal.  Neck: Normal range of motion.  Cardiovascular: Normal rate and  regular rhythm.  Pulmonary/Chest: Effort normal. She exhibits tenderness.  Abdominal: She exhibits no distension.  Musculoskeletal: Normal range of motion.  Neurological: She is alert and oriented to person, place, and time.  Psychiatric: She has a normal mood and affect.  Nursing note and vitals reviewed.    ED Treatments / Results  Labs (all labs ordered are listed, but only abnormal results are displayed) Labs Reviewed  CBG MONITORING, ED    EKG  EKG Interpretation None       Radiology Dg Chest 2 View  Result Date: 05/12/2017 CLINICAL DATA:  Cough, wheezing and central chest tightness. EXAM: CHEST  2 VIEW COMPARISON:  12/16/2011 FINDINGS: Cardiomediastinal silhouette is normal. Mediastinal contours appear intact. There is no evidence of focal airspace consolidation, pleural effusion or pneumothorax. Osseous structures are without acute abnormality.  Soft tissues are grossly normal. IMPRESSION: No active cardiopulmonary disease. Electronically Signed   By: Ted Mcalpineobrinka  Dimitrova M.D.   On: 05/12/2017 10:28    Procedures Procedures (including critical care time)  Medications Ordered in ED Medications - No data to display   Initial Impression / Assessment and Plan / ED Course  I have reviewed the triage vital signs and the nursing notes.  Pertinent labs & imaging results that were available during my care of the patient were reviewed by me and considered in my medical decision making (see chart for details).     Lungs are clear,  Soreness with movement.  Pt counseled.  I will treat her with ibuprofen.  I advised limit lifting.  Final Clinical Impressions(s) / ED Diagnoses   Final diagnoses:  Chest wall pain    ED Discharge Orders        Ordered    ibuprofen (ADVIL,MOTRIN) 600 MG tablet  Every 6 hours PRN     05/12/17 1201    An After Visit Summary was printed and given to the patient.   Elson AreasSofia, Leslie K, [place] JerseyPA-C 05/12/17 1545    Vanetta MuldersZackowski, Scott, MD 05/22/17 [phone]

## 2017-10-02 ENCOUNTER — Encounter (HOSPITAL_COMMUNITY): Payer: Self-pay

## 2017-10-02 ENCOUNTER — Other Ambulatory Visit: Payer: Self-pay

## 2017-10-02 ENCOUNTER — Emergency Department (HOSPITAL_COMMUNITY)
Admission: EM | Admit: 2017-10-02 | Discharge: 2017-10-03 | Disposition: A | Discharge status: Home / Self Care | Payer: Self-pay | Attending: Emergency Medicine | Admitting: Emergency Medicine

## 2017-10-02 ENCOUNTER — Emergency Department (HOSPITAL_COMMUNITY): Payer: Self-pay

## 2017-10-02 DIAGNOSIS — F17210 Nicotine dependence, cigarettes, uncomplicated: Secondary | ICD-10-CM | POA: Insufficient documentation

## 2017-10-02 DIAGNOSIS — R072 Precordial pain: Secondary | ICD-10-CM | POA: Insufficient documentation

## 2017-10-02 DIAGNOSIS — R51 Headache: Secondary | ICD-10-CM | POA: Insufficient documentation

## 2017-10-02 DIAGNOSIS — J45909 Unspecified asthma, uncomplicated: Secondary | ICD-10-CM | POA: Insufficient documentation

## 2017-10-02 DIAGNOSIS — R0789 Other chest pain: Secondary | ICD-10-CM

## 2017-10-02 DIAGNOSIS — Z8669 Personal history of other diseases of the nervous system and sense organs: Secondary | ICD-10-CM | POA: Insufficient documentation

## 2017-10-02 LAB — BASIC METABOLIC PANEL
Anion gap: 9 (ref 5–15)
BUN: 8 mg/dL (ref 6–20)
CHLORIDE: 109 mmol/L (ref 101–111)
CO2: 23 mmol/L (ref 22–32)
CREATININE: 0.7 mg/dL (ref 0.44–1.00)
Calcium: 9 mg/dL (ref 8.9–10.3)
GFR calc Af Amer: >60 mL/min (ref >60)
GFR calc non Af Amer: >60 mL/min (ref >60)
GLUCOSE: 95 mg/dL (ref 65–99)
POTASSIUM: 4 mmol/L (ref 3.5–5.1)
Sodium: 141 mmol/L (ref 135–145)

## 2017-10-02 LAB — I-STAT TROPONIN, ED
TROPONIN I, POC: 0 ng/mL (ref 0.00–0.08)
Troponin i, poc: 0.01 ng/mL (ref 0.00–0.08)

## 2017-10-02 LAB — CBC
HCT: 40.3 % (ref 36.0–46.0)
Hemoglobin: 13.8 g/dL (ref 12.0–15.0)
MCH: 31.2 pg (ref 26.0–34.0)
MCHC: 34.2 g/dL (ref 30.0–36.0)
MCV: 91 fL (ref 78.0–100.0)
PLATELETS: 336 10*3/uL (ref 150–400)
RBC: 4.43 MIL/uL (ref 3.87–5.11)
RDW: 12.4 % (ref 11.5–15.5)
WBC: 6.7 10*3/uL (ref 4.0–10.5)

## 2017-10-02 LAB — I-STAT BETA HCG BLOOD, ED (MC, WL, AP ONLY): I-stat hCG, quantitative: <5 m[IU]/mL (ref <5)

## 2017-10-02 MED ORDER — KETOROLAC TROMETHAMINE 30 MG/ML IJ SOLN
30.0000 mg | Freq: Once | INTRAMUSCULAR | Status: AC
  Administered 2017-10-02: 30 mg via INTRAMUSCULAR
  Filled 2017-10-02: qty 1

## 2017-10-02 NOTE — ED Provider Notes (Signed)
[facility] EMERGENCY DEPARTMENT Provider Note   CSN: [id number] Arrival date & time: 10/02/17  1600     History   Chief Complaint Chief Complaint  Patient presents with  . Chest Pain  . Headache    HPI Brooke Robertson is a 27 y.o. female with a PMHx of migraines, asthma, anxiety, and obesity, who presents to the ED with complaints of intermittent chest pain x3 days.  She states that she does a lot of heavy lifting such as moving heavy things and cleaning at her job, and thinks this is what has caused her symptoms.  She describes her chest pain is 4/10 intermittent sharp nonradiating left-sided chest pain which worsens with continuing to do activities at her job, unchanged with inspiration, and unrelieved with Tylenol and Advil.  She states that overall her chest pain is resolving at this time.  She also states she has had a migraine headache on the left side of her head which is the same as her typical migraines, no different than her usual, and is not concerning to her.  She is a non-smoker.  She states that she thinks her father had some heart issues but she is not sure exactly what type of issues he has had, denies that he is ever had an MI that she is aware of.  No other family history of cardiac disease that she is aware of.  She denies any vision changes, URI symptoms, cough, diaphoresis, lightheadedness, fevers, chills, SOB, LE swelling, recent travel/surgery/immobilization, personal/family hx of DVT/PE, abd pain, N/V/D/C, hematuria, dysuria, myalgias, arthralgias, claudication, orthopnea, numbness, tingling, focal weakness, or any other complaints at this time.   The history is provided by the patient and medical records. No language interpreter was used.  Chest Pain   This is a new problem. The current episode started more than 2 days ago. The problem occurs daily. The problem has been rapidly improving. The pain is associated with movement and lifting. The pain is  present in the lateral region. The pain is at a severity of 4/10. The pain is mild. The quality of the pain is described as brief and sharp. The pain does not radiate. Duration of episode(s) is 3 days. Exacerbated by: continuing to do activities at work. Associated symptoms include headaches (chronic, unchanged). Pertinent negatives include no abdominal pain, no claudication, no cough, no diaphoresis, no fever, no lower extremity edema, no nausea, no numbness, no orthopnea, no shortness of breath, no vomiting and no weakness. She has tried nothing for the symptoms. The treatment provided no relief. Risk factors include obesity.  Pertinent negatives for past medical history include no DVT and no PE.  Pertinent negatives for family medical history include: no CAD, no early MI and no PE.  Headache   Pertinent negatives include no fever, no orthopnea, no shortness of breath, no nausea and no vomiting.    Past Medical History:  Diagnosis Date  . Anxiety   . Asthma   . Depression   . Obesity     There are no active problems to display for this patient.   Past Surgical History:  Procedure Laterality Date  . TONSILLECTOMY       OB History    Gravida  0   Para      Term      Preterm      AB      Living        SAB      TAB  Ectopic      Multiple      Live Births               Home Medications    Prior to Admission medications   Medication Sig Start Date End Date Taking? Authorizing Provider  cephALEXin (KEFLEX) 500 MG capsule Take 1 capsule (500 mg total) by mouth 2 (two) times daily. 01/31/16   Garlon Hatchet, PA-C  docusate sodium (COLACE) 100 MG capsule Take 1 capsule (100 mg total) by mouth every 12 (twelve) hours. 01/31/16   Garlon Hatchet, PA-C  ibuprofen (ADVIL,MOTRIN) 600 MG tablet Take 1 tablet (600 mg total) by mouth every 6 (six) hours as needed. 05/12/17   Elson Areas, PA-C  polyethylene glycol powder (GLYCOLAX) powder Take 255 g by mouth once.  08/23/15   Bethel Born, PA-C    Family History Family History  Problem Relation Age of Onset  . Diabetes Mother   . Depression Father   . Hypertension Father   . Anxiety disorder Father     Social History Social History   Tobacco Use  . Smoking status: Current Some Day Smoker    Types: Cigarettes  . Smokeless tobacco: Never Used  Substance Use Topics  . Alcohol use: Yes    Comment: every other day  . Drug use: Yes    Types: Marijuana    Comment: socially     Allergies   Patient has no known allergies.   Review of Systems Review of Systems  Constitutional: Negative for chills, diaphoresis and fever.  HENT: Negative for rhinorrhea and sore throat.   Eyes: Negative for visual disturbance.  Respiratory: Negative for cough and shortness of breath.   Cardiovascular: Positive for chest pain. Negative for orthopnea, claudication and leg swelling.  Gastrointestinal: Negative for abdominal pain, constipation, diarrhea, nausea and vomiting.  Genitourinary: Negative for dysuria and hematuria.  Musculoskeletal: Negative for arthralgias and myalgias.  Skin: Negative for color change.  Allergic/Immunologic: Negative for immunocompromised state.  Neurological: Positive for headaches (chronic, unchanged). Negative for weakness, light-headedness and numbness.  Psychiatric/Behavioral: Negative for confusion.   All other systems reviewed and are negative for acute change except as noted in the HPI.    Physical Exam Updated Vital Signs BP 106/66   Pulse 61   Temp 98.4 F (36.9 C) (Oral)   Resp 16   Ht 5' (1.524 m)   LMP 10/02/2017   SpO2 99%   BMI 39.06 kg/m   Physical Exam  Constitutional: She is oriented to person, place, and time. Vital signs are normal. She appears well-developed and well-nourished.  Non-toxic appearance. No distress.  Afebrile, nontoxic, NAD  HENT:  Head: Normocephalic and atraumatic.  Mouth/Throat: Oropharynx is clear and moist and mucous  membranes are normal.  Eyes: Conjunctivae and EOM are normal. Right eye exhibits no discharge. Left eye exhibits no discharge.  Neck: Normal range of motion. Neck supple.  Cardiovascular: Normal rate, regular rhythm, normal heart sounds and intact distal pulses. Exam reveals no gallop and no friction rub.  No murmur heard. RRR, nl s1/s2, no m/r/g, distal pulses intact, no pedal edema   Pulmonary/Chest: Effort normal and breath sounds normal. No respiratory distress. She has no decreased breath sounds. She has no wheezes. She has no rhonchi. She has no rales. She exhibits tenderness. She exhibits no crepitus, no deformity and no retraction.  CTAB in all lung fields, no w/r/r, no hypoxia or increased WOB, speaking in full sentences, SpO2 99% on  RA Chest wall with mild TTP to upper L sternal border area, without crepitus, deformities, or retractions     Abdominal: Soft. Normal appearance and bowel sounds are normal. She exhibits no distension. There is no tenderness. There is no rigidity, no rebound, no guarding, no CVA tenderness, no tenderness at McBurney's point and negative Murphy's sign.  Musculoskeletal: Normal range of motion.  MAE x4 Strength and sensation grossly intact in all extremities Distal pulses intact No pedal edema, neg homan's bilaterally   Neurological: She is alert and oriented to person, place, and time. She has normal strength. No sensory deficit.  Skin: Skin is warm, dry and intact. No rash noted.  Psychiatric: She has a normal mood and affect.  Nursing note and vitals reviewed.    ED Treatments / Results  Labs (all labs ordered are listed, but only abnormal results are displayed) Labs Reviewed  BASIC METABOLIC PANEL  CBC  I-STAT TROPONIN, ED  I-STAT BETA HCG BLOOD, ED ([facility], [facility], [facility] ONLY)  I-STAT TROPONIN, ED    EKG EKG Interpretation  Date/Time:  Friday October 02 2017 16:14:03 EDT Ventricular Rate:  63 PR Interval:  136 QRS Duration: 84 QT  Interval:  370 QTC Calculation: 378 R Axis:   38 Text Interpretation:  Normal sinus rhythm Normal ECG No STEMI.  Confirmed by Alona Bene [id number]) on 10/02/2017 10:48:48 PM   Radiology Dg Chest 2 View  Result Date: 10/02/2017 CLINICAL DATA:  27 year old female with a history of chest pain EXAM: CHEST - 2 VIEW COMPARISON:  05/12/2017 FINDINGS: Cardiomediastinal silhouette unchanged in size and contour. No evidence of central vascular congestion. No pneumothorax or pleural effusion. No confluent airspace disease. No acute displaced fracture IMPRESSION: Negative for acute cardiopulmonary disease Electronically Signed   By: Gilmer Mor D.O.   On: 10/02/2017 17:19    Procedures Procedures (including critical care time)  Medications Ordered in ED Medications  ketorolac (TORADOL) 30 MG/ML injection 30 mg (30 mg Intramuscular Given 10/02/17 2307)     Initial Impression / Assessment and Plan / ED Course  I have reviewed the triage vital signs and the nursing notes.  Pertinent labs & imaging results that were available during my care of the patient were reviewed by me and considered in my medical decision making (see chart for details).     27 y.o. female here with CP x3 days, also c/o migraine HA but states this is her usual migraine and not any different than usual. Missed work today, states she works a lot and does a lot of moving heavy things and cleaning, thinks this is what caused her symptoms. On exam, mild chest wall TTP just left of sternum, no tachycardia or hypoxia, no pedal edema, neg homan's sign bilaterally, VSS. Work up thus far reveals: trop neg, betaHCG neg, CBC and BMP WNL, EKG unremarkable without acute ischemic findings, and CXR neg. Second troponin in process. Doubt PE/dissection/ACS or other acute cardiopulmonary etiology of her symptoms. Will await second trop, give toradol, and reassess shortly.   11:58 PM Second trop neg. Pt feeling much better. Pt low risk for acute  cardiopulmonary etiology, and has reassuring work up today. Symptoms likely related to musculoskeletal chest wall pain/costochondritis. Advised OTC remedies for symptomatic relief, and f/up with PCP in 1wk for recheck. I explained the diagnosis and have given explicit precautions to return to the ER including for any other new or worsening symptoms. The patient understands and accepts the medical plan as it's been dictated and I  have answered their questions. Discharge instructions concerning home care and prescriptions have been given. The patient is STABLE and is discharged to home in good condition.    Final Clinical Impressions(s) / ED Diagnoses   Final diagnoses:  Precordial chest pain  Chest wall pain  Hx of migraines    ED Discharge Orders    [address]       Nashae Maudlin, [place]inner[place]cedes, [place]-C 10/03/17 0000    Doug SouJacubowitz, Sam, MD 10/03/17 [phone]

## 2017-10-02 NOTE — Discharge Instructions (Signed)
Your labs, xray, and EKG are all reassuring, your chest pain could be a variety of things including gas pain, muscle pain, and other nonemergent issues. Alternate between tylenol and ibuprofen as directed as needed for pain. Stay well hydrated. You may consider using heat to the areas of pain, no more than 20 minutes every hour. Follow up with your regular doctor in 1 week for recheck of symptoms. Return to the ER for changes or worsening symptoms. ° °SEEK IMMEDIATE MEDICAL ATTENTION IF: °You develop a fever.  °Your chest pains become severe or intolerable.  °You develop new, unexplained symptoms (problems).  °You develop shortness of breath, nausea, vomiting, sweating or feel light headed.  °You develop a new cough or you cough up blood. °You develop new leg swelling °

## 2017-10-02 NOTE — ED Triage Notes (Signed)
Patient here from home for chest pain and headaches for 3 days.  Tried to sleep but said that sleeping didn't help.

## 2019-01-23 ENCOUNTER — Other Ambulatory Visit: Payer: Self-pay

## 2019-01-23 ENCOUNTER — Emergency Department (HOSPITAL_COMMUNITY)
Admission: EM | Admit: 2019-01-23 | Discharge: 2019-01-23 | Disposition: A | Discharge status: Home / Self Care | Payer: Medicaid Other | Attending: Emergency Medicine | Admitting: Emergency Medicine

## 2019-01-23 ENCOUNTER — Encounter (HOSPITAL_COMMUNITY): Payer: Self-pay

## 2019-01-23 DIAGNOSIS — K0889 Other specified disorders of teeth and supporting structures: Secondary | ICD-10-CM

## 2019-01-23 DIAGNOSIS — F17210 Nicotine dependence, cigarettes, uncomplicated: Secondary | ICD-10-CM | POA: Insufficient documentation

## 2019-01-23 DIAGNOSIS — J45909 Unspecified asthma, uncomplicated: Secondary | ICD-10-CM | POA: Insufficient documentation

## 2019-01-23 MED ORDER — AMOXICILLIN-POT CLAVULANATE 875-125 MG PO TABS: 1.0000 | ORAL_TABLET | Freq: Two times a day (BID) | ORAL | 0 refills | Status: DC

## 2019-01-23 MED ORDER — NAPROXEN 500 MG PO TABS: 500.0000 mg | ORAL_TABLET | Freq: Two times a day (BID) | ORAL | 0 refills | Status: DC

## 2019-01-23 MED ORDER — NAPROXEN 250 MG PO TABS
500.0000 mg | ORAL_TABLET | Freq: Once | ORAL | Status: AC
  Administered 2019-01-23: 13:00:00 500 mg via ORAL
  Filled 2019-01-23: qty 2

## 2019-01-23 MED ORDER — AMOXICILLIN-POT CLAVULANATE 875-125 MG PO TABS
1.0000 | ORAL_TABLET | Freq: Once | ORAL | Status: AC
  Administered 2019-01-23: 1 via ORAL
  Filled 2019-01-23: qty 1

## 2019-01-23 NOTE — Discharge Instructions (Signed)
Please take entire course of antibiotics as directed.  Continue using ibuprofen and Tylenol, as well as prescribed Orajel for pain.  You will need to follow-up with your dentist for continued management of this.  Return to the emergency department for fevers, swelling or pain under the tongue or in the neck, difficulty breathing or swallowing or any other new or concerning symptoms. 

## 2019-01-23 NOTE — ED Triage Notes (Signed)
Pt has a tooth ache that has been going on & off for two days (one of her Upper-Rt teeth). Today the pain is unbearable & has shooting pains that go up to her head. She is currently having a headache that wont go away & states she thinks its from the tooth.

## 2019-01-23 NOTE — ED Provider Notes (Addendum)
[place] EMERGENCY DEPARTMENT Provider Note   CSN: [id number] Arrival date & time: 01/23/19  1135    History   Chief Complaint Chief Complaint  Patient presents with  . Dental Pain  . Headache    HPI Brooke Robertson is a 28 y.o. female.     Brooke Robertson is a 28 y.o. female with a history of asthma, obesity, anxiety and depression, who presents to the ED for evaluation of dental pain.  She reports pain over 2 of her right upper molars that are broken off at the gumline.  She reports that she has intermittently had some pain over these teeth but starting yesterday pain became severe. Pain is described as sharp. She reports today pain has gotten so bad that she now has a headache.  She denies associated fevers or chills.  No facial swelling.  She has not noticed any abscess or drainage.  No difficulty swallowing or breathing.  No pain under the tongue.  She has not taken anything for her symptoms prior to arrival.  No other aggravating or alleviating factors.  Reports that she was recently seen for a teeth cleaning at her dentist, but could not afford to have these teeth pulled at this time.     Past Medical History:  Diagnosis Date  . Anxiety   . Asthma   . Depression   . Obesity     There are no active problems to display for this patient.   Past Surgical History:  Procedure Laterality Date  . TONSILLECTOMY       OB History    Gravida  0   Para      Term      Preterm      AB      Living        SAB      TAB      Ectopic      Multiple      Live Births               Home Medications    Prior to Admission medications   Medication Sig Start Date End Date Taking? Authorizing Provider  acetaminophen (TYLENOL) 325 MG tablet Take 650 mg by mouth every 6 (six) hours as needed for mild pain.    [provider]  amoxicillin-clavulanate (AUGMENTIN) 875-125 MG tablet Take 1 tablet by mouth 2 (two) times daily. One po bid x 7 days  01/23/19   Jacqlyn Larsen, PA-C  cephALEXin (KEFLEX) 500 MG capsule Take 1 capsule (500 mg total) by mouth 2 (two) times daily. Patient not taking: Reported on 10/02/2017 01/31/16   Larene Pickett, PA-C  docusate sodium (COLACE) 100 MG capsule Take 1 capsule (100 mg total) by mouth every 12 (twelve) hours. Patient not taking: Reported on 10/02/2017 01/31/16   Larene Pickett, PA-C  ibuprofen (ADVIL,MOTRIN) 600 MG tablet Take 1 tablet (600 mg total) by mouth every 6 (six) hours as needed. Patient not taking: Reported on 10/02/2017 05/12/17   Fransico Meadow, PA-C  naproxen (NAPROSYN) 500 MG tablet Take 1 tablet (500 mg total) by mouth 2 (two) times daily. 01/23/19   Jacqlyn Larsen, PA-C  polyethylene glycol powder (GLYCOLAX) powder Take 255 g by mouth once. Patient not taking: Reported on 10/02/2017 08/23/15   Recardo Evangelist, PA-C    Family History Family History  Problem Relation Age of Onset  . Diabetes Mother   . Depression Father   .  Hypertension Father   . Anxiety disorder Father     Social History Social History   Tobacco Use  . Smoking status: Current Some Day Smoker    Types: Cigarettes  . Smokeless tobacco: Never Used  Substance Use Topics  . Alcohol use: Yes    Comment: every other day  . Drug use: Yes    Types: Marijuana    Comment: socially     Allergies   Patient has no known allergies.   Review of Systems Review of Systems  Constitutional: Negative for chills and fever.  HENT: Positive for dental problem. Negative for facial swelling, sore throat and trouble swallowing.   Gastrointestinal: Negative for nausea and vomiting.  Musculoskeletal: Negative for neck pain and neck stiffness.     Physical Exam Updated Vital Signs BP 108/79 (BP Location: Right Arm)   Pulse 65   Temp 98.9 F (37.2 C) (Oral)   Resp 20   Ht 5' (1.524 m)   Wt 97.5 kg   SpO2 100%   BMI 41.99 kg/m   Physical Exam Vitals signs and nursing note reviewed.  Constitutional:       General: She is not in acute distress.    Appearance: Normal appearance. She is well-developed. She is obese. She is not ill-appearing or diaphoretic.  HENT:     Head: Normocephalic and atraumatic.     Mouth/Throat:      Comments: No swelling or fluctuance over roof of mouth.  No sublingual pain or tenderness.  No associated facial swelling.  Posterior oropharynx is clear.  No trismus, tolerating secretions Eyes:     General:        Right eye: No discharge.        Left eye: No discharge.  Neck:     Musculoskeletal: Neck supple.     Comments: No masses, no stridor, no torticollis Pulmonary:     Effort: Pulmonary effort is normal. No respiratory distress.  Musculoskeletal:        General: No deformity.  Skin:    General: Skin is warm and dry.  Neurological:     Mental Status: She is alert.     Coordination: Coordination normal.  Psychiatric:        Mood and Affect: Mood normal.        Behavior: Behavior normal.      ED Treatments / Results  Labs (all labs ordered are listed, but only abnormal results are displayed) Labs Reviewed - No data to display  EKG None  Radiology No results found.  Procedures Procedures (including critical care time)  Medications Ordered in ED Medications  amoxicillin-clavulanate (AUGMENTIN) 875-125 MG per tablet 1 tablet (1 tablet Oral Given 01/23/19 1310)  naproxen (NAPROSYN) tablet 500 mg (500 mg Oral Given 01/23/19 1310)     Initial Impression / Assessment and Plan / ED Course  I have reviewed the triage vital signs and the nursing notes.  Pertinent labs & imaging results that were available during my care of the patient were reviewed by me and considered in my medical decision making (see chart for details).  Patient with toothache.  No gross abscess.  Exam unconcerning for Ludwig's angina or spread of infection.  Will treat with augmentin and anti-inflammatories medicine.  Urged patient to follow-up with dentist.     Final Clinical  Impressions(s) / ED Diagnoses   Final diagnoses:  Pain, dental    ED Discharge Orders         Ordered  amoxicillin-clavulanate (AUGMENTIN) 875-125 MG tablet  2 times daily     01/23/19 1413    naproxen (NAPROSYN) 500 MG tablet  2 times daily     01/23/19 1413              Dartha LodgeFord, Jash Wahlen N, [place]PA-C 01/23/19 1720    Milagros Lollykstra, Richard S, MD 01/24/19 1320

## 2019-01-26 ENCOUNTER — Other Ambulatory Visit: Payer: Self-pay | Admitting: *Deleted

## 2019-01-26 DIAGNOSIS — Z20822 Contact with and (suspected) exposure to covid-19: Secondary | ICD-10-CM

## 2019-01-27 LAB — NOVEL CORONAVIRUS, NAA: SARS-CoV-2, NAA: NOT DETECTED

## 2019-02-15 ENCOUNTER — Emergency Department (HOSPITAL_COMMUNITY)
Admission: EM | Admit: 2019-02-15 | Discharge: 2019-02-15 | Disposition: A | Discharge status: Home / Self Care | Payer: Medicaid Other | Attending: Emergency Medicine | Admitting: Emergency Medicine

## 2019-02-15 ENCOUNTER — Inpatient Hospital Stay
Admission: RE | Admit: 2019-02-15 | Discharge: 2019-02-15 | Disposition: A | Discharge status: Home / Self Care | Payer: Medicaid Other | Source: Ambulatory Visit

## 2019-02-15 DIAGNOSIS — K0889 Other specified disorders of teeth and supporting structures: Secondary | ICD-10-CM | POA: Diagnosis present

## 2019-02-15 DIAGNOSIS — Z79899 Other long term (current) drug therapy: Secondary | ICD-10-CM | POA: Insufficient documentation

## 2019-02-15 DIAGNOSIS — J45909 Unspecified asthma, uncomplicated: Secondary | ICD-10-CM | POA: Diagnosis not present

## 2019-02-15 DIAGNOSIS — F17210 Nicotine dependence, cigarettes, uncomplicated: Secondary | ICD-10-CM | POA: Diagnosis not present

## 2019-02-15 MED ORDER — OXYCODONE-ACETAMINOPHEN 5-325 MG PO TABS: 1.0000 | ORAL_TABLET | Freq: Four times a day (QID) | ORAL | 0 refills | Status: DC | PRN

## 2019-02-15 NOTE — ED Notes (Signed)
Patient verbalizes understanding of discharge instructions. Opportunity for questioning and answering were provided.  patient discharged from ED.  

## 2019-02-15 NOTE — Discharge Instructions (Signed)
Please read attached information. If you experience any new or worsening signs or symptoms please return to the emergency room for evaluation. Please follow-up with your primary care provider or specialist as discussed. Please use medication prescribed only as directed and discontinue taking if you have any concerning signs or symptoms.   °

## 2019-02-15 NOTE — ED Provider Notes (Signed)
[facility] Provider Note   CSN: [id number] Arrival date & time: 02/15/19  1614     History   Chief Complaint Chief Complaint  Patient presents with  . Dental Pain    HPI Brooke Robertson is a 28 y.o. female.     HPI   28 year old female presents today with complaints of dental pain.  Patient notes she was seen at a [facility] dental clinic this morning where she had a tooth pulled.  She notes that she felt bone within the socket and went back to the clinic.  She notes that the blood clot had dislodged and the bone was exposed.  She notes that the dentist did not give her any further instructions how to manage this.  She notes she is having some pain at the site no significant swelling.    Past Medical History:  Diagnosis Date  . Anxiety   . Asthma   . Depression   . Obesity     There are no active problems to display for this patient.   Past Surgical History:  Procedure Laterality Date  . TONSILLECTOMY       OB History    Gravida  0   Para      Term      Preterm      AB      Living        SAB      TAB      Ectopic      Multiple      Live Births               Home Medications    Prior to Admission medications   Medication Sig Start Date End Date Taking? Authorizing Provider  acetaminophen (TYLENOL) 325 MG tablet Take 650 mg by mouth every 6 (six) hours as needed for mild pain.    [provider]  amoxicillin-clavulanate (AUGMENTIN) 875-125 MG tablet Take 1 tablet by mouth 2 (two) times daily. One po bid x 7 days 01/23/19   Dartha LodgeFord, Kelsey N, PA-C  cephALEXin (KEFLEX) 500 MG capsule Take 1 capsule (500 mg total) by mouth 2 (two) times daily. Patient not taking: Reported on 10/02/2017 01/31/16   Garlon HatchetSanders, Lisa M, PA-C  docusate sodium (COLACE) 100 MG capsule Take 1 capsule (100 mg total) by mouth every 12 (twelve) hours. Patient not taking: Reported on 10/02/2017 01/31/16   Garlon HatchetSanders, Lisa M, PA-C  ibuprofen  (ADVIL,MOTRIN) 600 MG tablet Take 1 tablet (600 mg total) by mouth every 6 (six) hours as needed. Patient not taking: Reported on 10/02/2017 05/12/17   Elson AreasSofia, Leslie K, PA-C  naproxen (NAPROSYN) 500 MG tablet Take 1 tablet (500 mg total) by mouth 2 (two) times daily. 01/23/19   Dartha LodgeFord, Kelsey N, PA-C  oxyCODONE-acetaminophen (PERCOCET/ROXICET) 5-325 MG tablet Take 1 tablet by mouth every 6 (six) hours as needed. 02/15/19   Amani Marseille, Tinnie GensJeffrey, PA-C  polyethylene glycol powder (GLYCOLAX) powder Take 255 g by mouth once. Patient not taking: Reported on 10/02/2017 08/23/15   Bethel BornGekas, Kelly Marie, PA-C    Family History Family History  Problem Relation Age of Onset  . Diabetes Mother   . Depression Father   . Hypertension Father   . Anxiety disorder Father     Social History Social History   Tobacco Use  . Smoking status: Current Some Day Smoker    Types: Cigarettes  . Smokeless tobacco: Never Used  Substance Use Topics  . Alcohol use:  Yes    Comment: every other day  . Drug use: Yes    Types: Marijuana    Comment: socially     Allergies   Patient has no known allergies.   Review of Systems Review of Systems  All other systems reviewed and are negative.    Physical Exam Updated Vital Signs BP 125/82 (BP Location: Right Arm)   Pulse 74   Temp 99.2 F (37.3 C) (Oral)   Resp 16   SpO2 100%   Physical Exam Vitals signs and nursing note reviewed.  Constitutional:      Appearance: She is well-developed.  HENT:     Head: Normocephalic and atraumatic.     Mouth/Throat:     Comments: Tooth #4 status post extraction no clot noted within the extraction site no surrounding erythema or swelling Eyes:     General: No scleral icterus.       Right eye: No discharge.        Left eye: No discharge.     Conjunctiva/sclera: Conjunctivae normal.     Pupils: Pupils are equal, round, and reactive to light.  Neck:     Musculoskeletal: Normal range of motion.     Vascular: No JVD.      Trachea: No tracheal deviation.  Pulmonary:     Effort: Pulmonary effort is normal.     Breath sounds: No stridor.  Neurological:     Mental Status: She is alert and oriented to person, place, and time.     Coordination: Coordination normal.  Psychiatric:        Behavior: Behavior normal.        Thought Content: Thought content normal.        Judgment: Judgment normal.      ED Treatments / Results  Labs (all labs ordered are listed, but only abnormal results are displayed) Labs Reviewed - No data to display  EKG None  Radiology No results found.  Procedures Procedures (including critical care time)  Medications Ordered in ED Medications - No data to display   Initial Impression / Assessment and Plan / ED Course  I have reviewed the triage vital signs and the nursing notes.  Pertinent labs & imaging results that were available during my care of the patient were reviewed by me and considered in my medical decision making (see chart for details).        28 year old female presents today with complaints of dental pain.  Patient status post tooth extraction.  It appears the blood clot has come out of the extraction site.  She did follow-up with the dentist but reports he gave her no guidance.  I see no indication for treatment at this time, will refer her back to the dentist to perform this, I will also give her follow-up information for on-call dentist if she has any questions or concerns.  She can return immediately if she develops any new or worsening signs or symptoms.  I will prescribe her Percocet encouraged her to use Tylenol and ibuprofen.  She verbalized understanding and agreement to today's plan.  Final Clinical Impressions(s) / ED Diagnoses   Final diagnoses:  Pain, dental    ED Discharge Orders         Ordered    oxyCODONE-acetaminophen (PERCOCET/ROXICET) 5-325 MG tablet  Every 6 hours PRN     02/15/19 1738           Okey Regal, PA-C 02/15/19  1740    Tegeler, Gwenyth Allegra, MD  02/15/19 1745  

## 2019-02-15 NOTE — ED Triage Notes (Signed)
Pt states she had her top right tooth pulled out this morning and states that she is in a lot of pain ; denies any fevers

## 2019-04-27 ENCOUNTER — Ambulatory Visit: Payer: Medicaid Other | Admitting: Obstetrics and Gynecology

## 2019-05-05 ENCOUNTER — Ambulatory Visit: Payer: Medicaid Other | Admitting: Obstetrics and Gynecology

## 2019-05-05 ENCOUNTER — Encounter: Payer: Self-pay | Admitting: General Practice

## 2019-05-05 ENCOUNTER — Encounter: Payer: Self-pay | Admitting: Obstetrics and Gynecology

## 2019-05-05 ENCOUNTER — Other Ambulatory Visit: Payer: Self-pay

## 2019-05-05 VITALS — BP 126/81 | HR 64 | Temp 98.0°F | Ht 60.0 in | Wt 228.2 lb

## 2019-05-05 DIAGNOSIS — Z3046 Encounter for surveillance of implantable subdermal contraceptive: Secondary | ICD-10-CM | POA: Diagnosis not present

## 2019-05-05 NOTE — Progress Notes (Signed)
Ms. Brooke Robertson is a G58P1001 female in the office today for removal of Nexplanon; which was inserted 3 years ago at her previous GYN office. She does not desire to have it replaced today. She has no complaints today.  BP 126/81 (BP Location: Left Arm, Patient Position: Sitting, Cuff Size: Large)   Pulse 64   Temp 98 F (36.7 C) (Oral)   Ht 5' (1.524 m)   Wt 228 lb 3.2 oz (103.5 kg)   LMP 04/01/2019 (Approximate)   BMI 44.57 kg/m    Procedure Note: Consent obtained and Time-Out conducted Implant palpated in left upper arm Betadine prep done on area of excision/removal Lidocaine infiltrated into intradermal and subcutaneous space Small 20mm incision made with scalpel at the proximal end of implant. (not able to palpate distal end)  Pressure applied to distal end of implant which exposed the tip through incision Tip of implant grasped with hemostat There was some adherence of implant in subcutaneous tissue.  Twisting and manipulation freed the implant from the capsule Implant removed Pressure held on incision until bleeding stopped Steristrips applied to incision Pressure dressing applied by RN Patient tolerated procedure well.   Assessment and Plan: Encounter for Nexplanon removal  - Discussed possibility of pregnancy with no contraception. Patient states she and FOB are ok with another pregnancy. - Advised to follow-up in the office with (+) HPT or prn GYN needs - Patient verbalized an understanding of the plan of care and agrees.   Time spent with patient doing procedure 15 minutes.  Laury Deep, CNM  05/05/2019

## 2019-06-02 ENCOUNTER — Ambulatory Visit: Payer: Medicaid Other | Admitting: Obstetrics and Gynecology

## 2019-06-06 ENCOUNTER — Encounter: Payer: Self-pay | Admitting: General Practice

## 2020-03-10 ENCOUNTER — Other Ambulatory Visit: Payer: Self-pay

## 2020-03-10 ENCOUNTER — Encounter (HOSPITAL_COMMUNITY): Payer: Self-pay

## 2020-03-10 ENCOUNTER — Ambulatory Visit (HOSPITAL_COMMUNITY)
Admission: EM | Admit: 2020-03-10 | Discharge: 2020-03-10 | Disposition: A | Discharge status: Home / Self Care | Payer: Medicaid Other | Attending: Urgent Care | Admitting: Urgent Care

## 2020-03-10 DIAGNOSIS — R1013 Epigastric pain: Secondary | ICD-10-CM | POA: Diagnosis not present

## 2020-03-10 DIAGNOSIS — Z3201 Encounter for pregnancy test, result positive: Secondary | ICD-10-CM

## 2020-03-10 DIAGNOSIS — R519 Headache, unspecified: Secondary | ICD-10-CM

## 2020-03-10 LAB — POCT URINALYSIS DIPSTICK, ED / UC
Bilirubin Urine: NEGATIVE
Glucose, UA: NEGATIVE mg/dL
Hgb urine dipstick: NEGATIVE
Ketones, ur: 15 mg/dL — AB
Leukocytes,Ua: NEGATIVE
Nitrite: NEGATIVE
Protein, ur: NEGATIVE mg/dL
Specific Gravity, Urine: <=1.005 (ref 1.005–1.030)
Urobilinogen, UA: 0.2 mg/dL (ref 0.0–1.0)
pH: 5.5 (ref 5.0–8.0)

## 2020-03-10 LAB — POC URINE PREG, ED: Preg Test, Ur: POSITIVE — AB

## 2020-03-10 NOTE — Discharge Instructions (Addendum)
Do not use any nonsteroidal anti-inflammatories (NSAIDs) like ibuprofen, Motrin, naproxen, Aleve, etc. which are all available over-the-counter.  Please just use Tylenol at a dose of 500mg-650mg once every 6 hours as needed for your aches, pains, fevers. 

## 2020-03-10 NOTE — ED Provider Notes (Signed)
[facility]   MRN: [id number] DOB: 1990-06-20  Subjective:   Brooke Robertson is a 29 y.o. female presenting for intermittent generalized headaches, mild upper abdominal pain, intermittent upper back pain.  Patient tries to hydrate well.  She has been drinking a little more lately, is not normally drinking.  Tries to eat healthfully.  States that she has actively lost about 20 pounds in the last year.  Tries to practice healthy diet.  She is really stressed out, states that she just lost her job.  Denies fever, confusion, vision change, runny or stuffy nose, sore throat, cough, chest pain, shortness of breath, dysuria, hematuria, vaginal discharge, body aches.  Patient is sexually active, does not use condoms for protection.  She is not on any birth control.  LMP was 02/08/2020.  Denies taking any chronic medications.    No Known Allergies  Past Medical History:  Diagnosis Date   Anxiety    Asthma    Depression    Gestational diabetes mellitus (GDM)    Obesity      Past Surgical History:  Procedure Laterality Date   TONSILLECTOMY      Family History  Problem Relation Age of Onset   Diabetes Mother    Depression Father    Hypertension Father    Anxiety disorder Father     Social History   Tobacco Use   Smoking status: Former Smoker    Types: Cigarettes    Quit date: 05/04/2016    Years since quitting: 3.8   Smokeless tobacco: Never Used  Vaping Use   Vaping Use: Never used  Substance Use Topics   Alcohol use: Yes    Comment: occ   Drug use: Not Currently    Types: Marijuana    Comment: socially    ROS   Objective:   Vitals: BP 99/60    Pulse 72    Temp 99 F (37.2 C) (Oral)    Resp 18    Ht 5' (1.524 m)    Wt 212 lb (96.2 kg)    SpO2 100%    BMI 41.40 kg/m   Physical Exam Constitutional:      General: She is not in acute distress.    Appearance: Normal appearance. She is well-developed. She is obese. She is not  ill-appearing, toxic-appearing or diaphoretic.  HENT:     Head: Normocephalic and atraumatic.     Right Ear: External ear normal.     Left Ear: External ear normal.     Nose: Nose normal.     Mouth/Throat:     Mouth: Mucous membranes are moist.     Pharynx: Oropharynx is clear.  Eyes:     General: No scleral icterus.       Right eye: No discharge.        Left eye: No discharge.     Extraocular Movements: Extraocular movements intact.     Conjunctiva/sclera: Conjunctivae normal.     Pupils: Pupils are equal, round, and reactive to light.  Cardiovascular:     Rate and Rhythm: Normal rate and regular rhythm.     Heart sounds: Normal heart sounds. No murmur heard.  No friction rub. No gallop.   Pulmonary:     Effort: Pulmonary effort is normal. No respiratory distress.     Breath sounds: Normal breath sounds. No stridor. No wheezing, rhonchi or rales.  Abdominal:     General: Bowel sounds are normal. There is no distension.  Palpations: Abdomen is soft. There is no mass.     Tenderness: There is no abdominal tenderness. There is no right CVA tenderness, left CVA tenderness, guarding or rebound.  Skin:    General: Skin is warm and dry.     Coloration: Skin is not pale.     Findings: No rash.  Neurological:     General: No focal deficit present.     Mental Status: She is alert and oriented to person, place, and time.     Cranial Nerves: No cranial nerve deficit.     Motor: No weakness.     Coordination: Coordination normal.     Gait: Gait normal.     Deep Tendon Reflexes: Reflexes normal.  Psychiatric:        Mood and Affect: Mood normal.        Behavior: Behavior normal.        Thought Content: Thought content normal.        Judgment: Judgment normal.     Results for orders placed or performed during the hospital encounter of 03/10/20 (from the past 24 hour(s))  POC Urinalysis dipstick     Status: Abnormal   Collection Time: 03/10/20  1:24 PM  Result Value Ref Range    Glucose, UA NEGATIVE NEGATIVE mg/dL   Bilirubin Urine NEGATIVE NEGATIVE   Ketones, ur 15 (A) NEGATIVE mg/dL   Specific Gravity, Urine <=1.005 1.005 - 1.030   Hgb urine dipstick NEGATIVE NEGATIVE   pH 5.5 5.0 - 8.0   Protein, ur NEGATIVE NEGATIVE mg/dL   Urobilinogen, UA 0.2 0.0 - 1.0 mg/dL   Nitrite NEGATIVE NEGATIVE   Leukocytes,Ua NEGATIVE NEGATIVE  POC urine pregnancy     Status: Abnormal   Collection Time: 03/10/20  1:30 PM  Result Value Ref Range   Preg Test, Ur POSITIVE (A) NEGATIVE    Assessment and Plan :   PDMP not reviewed this encounter.  1. Positive pregnancy test   2. Epigastric pain   3. Generalized headaches     Counseled patient on positive pregnancy test, recommendations for first trimester of pregnancy.  Recommend she establish care for pregnancy care.  Information provided to her.  Use Tylenol as needed for headaches, general aches and pains. Counseled patient on potential for adverse effects with medications prescribed/recommended today, ER and return-to-clinic precautions discussed, patient verbalized understanding.    Wallis Bamberg, PA-C 03/10/20 1345

## 2020-03-10 NOTE — ED Triage Notes (Signed)
Pt c/o 4/10 sharp intermittent pain in mid upper abdomen and HAx3 days. Pt denies N/V/D. Pt denies urinary issues.

## 2020-03-14 ENCOUNTER — Inpatient Hospital Stay (HOSPITAL_COMMUNITY)
Admission: AD | Admit: 2020-03-14 | Discharge: 2020-03-14 | Disposition: A | Discharge status: Home / Self Care | Payer: Medicaid Other | Attending: Obstetrics and Gynecology | Admitting: Obstetrics and Gynecology

## 2020-03-14 ENCOUNTER — Encounter (HOSPITAL_COMMUNITY): Payer: Self-pay | Admitting: Obstetrics and Gynecology

## 2020-03-14 ENCOUNTER — Inpatient Hospital Stay (HOSPITAL_COMMUNITY): Payer: Medicaid Other

## 2020-03-14 ENCOUNTER — Other Ambulatory Visit: Payer: Self-pay

## 2020-03-14 DIAGNOSIS — N939 Abnormal uterine and vaginal bleeding, unspecified: Secondary | ICD-10-CM

## 2020-03-14 DIAGNOSIS — O24419 Gestational diabetes mellitus in pregnancy, unspecified control: Secondary | ICD-10-CM | POA: Diagnosis not present

## 2020-03-14 DIAGNOSIS — Z833 Family history of diabetes mellitus: Secondary | ICD-10-CM | POA: Insufficient documentation

## 2020-03-14 DIAGNOSIS — Z87891 Personal history of nicotine dependence: Secondary | ICD-10-CM | POA: Insufficient documentation

## 2020-03-14 DIAGNOSIS — O99511 Diseases of the respiratory system complicating pregnancy, first trimester: Secondary | ICD-10-CM | POA: Diagnosis not present

## 2020-03-14 DIAGNOSIS — M546 Pain in thoracic spine: Secondary | ICD-10-CM | POA: Insufficient documentation

## 2020-03-14 DIAGNOSIS — J45909 Unspecified asthma, uncomplicated: Secondary | ICD-10-CM | POA: Diagnosis not present

## 2020-03-14 DIAGNOSIS — Z8249 Family history of ischemic heart disease and other diseases of the circulatory system: Secondary | ICD-10-CM | POA: Diagnosis not present

## 2020-03-14 DIAGNOSIS — O4691 Antepartum hemorrhage, unspecified, first trimester: Secondary | ICD-10-CM

## 2020-03-14 DIAGNOSIS — Z8759 Personal history of other complications of pregnancy, childbirth and the puerperium: Secondary | ICD-10-CM | POA: Insufficient documentation

## 2020-03-14 DIAGNOSIS — O3680X0 Pregnancy with inconclusive fetal viability, not applicable or unspecified: Secondary | ICD-10-CM | POA: Diagnosis not present

## 2020-03-14 DIAGNOSIS — O99211 Obesity complicating pregnancy, first trimester: Secondary | ICD-10-CM | POA: Insufficient documentation

## 2020-03-14 DIAGNOSIS — R110 Nausea: Secondary | ICD-10-CM | POA: Diagnosis not present

## 2020-03-14 DIAGNOSIS — O09291 Supervision of pregnancy with other poor reproductive or obstetric history, first trimester: Secondary | ICD-10-CM | POA: Diagnosis not present

## 2020-03-14 DIAGNOSIS — Z3A01 Less than 8 weeks gestation of pregnancy: Secondary | ICD-10-CM | POA: Diagnosis not present

## 2020-03-14 DIAGNOSIS — R102 Pelvic and perineal pain: Secondary | ICD-10-CM | POA: Insufficient documentation

## 2020-03-14 DIAGNOSIS — O209 Hemorrhage in early pregnancy, unspecified: Secondary | ICD-10-CM | POA: Insufficient documentation

## 2020-03-14 HISTORY — DX: Unspecified pre-eclampsia, unspecified trimester: O14.90

## 2020-03-14 LAB — URINALYSIS, ROUTINE W REFLEX MICROSCOPIC
Bilirubin Urine: NEGATIVE
Glucose, UA: NEGATIVE mg/dL
Ketones, ur: NEGATIVE mg/dL
Nitrite: NEGATIVE
Protein, ur: NEGATIVE mg/dL
Specific Gravity, Urine: 1.009 (ref 1.005–1.030)
pH: 6 (ref 5.0–8.0)

## 2020-03-14 LAB — COMPREHENSIVE METABOLIC PANEL
ALT: 13 U/L (ref 0–44)
AST: 16 U/L (ref 15–41)
Albumin: 3.6 g/dL (ref 3.5–5.0)
Alkaline Phosphatase: 39 U/L (ref 38–126)
Anion gap: 12 (ref 5–15)
BUN: 5 mg/dL — ABNORMAL LOW (ref 6–20)
CO2: 22 mmol/L (ref 22–32)
Calcium: 8.9 mg/dL (ref 8.9–10.3)
Chloride: 105 mmol/L (ref 98–111)
Creatinine, Ser: 0.65 mg/dL (ref 0.44–1.00)
GFR calc Af Amer: >60 mL/min (ref >60)
GFR calc non Af Amer: >60 mL/min (ref >60)
Glucose, Bld: 98 mg/dL (ref 70–99)
Potassium: 3.9 mmol/L (ref 3.5–5.1)
Sodium: 139 mmol/L (ref 135–145)
Total Bilirubin: 0.5 mg/dL (ref 0.3–1.2)
Total Protein: 7 g/dL (ref 6.5–8.1)

## 2020-03-14 LAB — CBC
HCT: 40.8 % (ref 36.0–46.0)
Hemoglobin: 13.9 g/dL (ref 12.0–15.0)
MCH: 31.6 pg (ref 26.0–34.0)
MCHC: 34.1 g/dL (ref 30.0–36.0)
MCV: 92.7 fL (ref 80.0–100.0)
Platelets: 330 10*3/uL (ref 150–400)
RBC: 4.4 MIL/uL (ref 3.87–5.11)
RDW: 12.7 % (ref 11.5–15.5)
WBC: 6.4 10*3/uL (ref 4.0–10.5)
nRBC: 0 % (ref 0.0–0.2)

## 2020-03-14 LAB — WET PREP, GENITAL
Clue Cells Wet Prep HPF POC: NONE SEEN
Sperm: NONE SEEN
Trich, Wet Prep: NONE SEEN
Yeast Wet Prep HPF POC: NONE SEEN

## 2020-03-14 LAB — HCG, QUANTITATIVE, PREGNANCY: hCG, Beta Chain, Quant, S: 239 m[IU]/mL — ABNORMAL HIGH (ref <5)

## 2020-03-14 MED ORDER — SIMETHICONE 80 MG PO CHEW
80.0000 mg | CHEWABLE_TABLET | Freq: Once | ORAL | Status: AC
  Administered 2020-03-14: 80 mg via ORAL
  Filled 2020-03-14: qty 1

## 2020-03-14 NOTE — MAU Provider Note (Addendum)
History     CSN: [id number]  Arrival date and time: 03/14/20 4627   First Provider Initiated Contact with Patient 03/14/20 0935      Chief Complaint  Patient presents with   Vaginal Bleeding   HPI: Brooke Robertson is a 29 y.o. G2P1001 female at [redacted]w[redacted]d in the MAU with complaints of abdominal cramping and vaginal bleeding. She states the abdominal cramping began last week prior to her +pregnancy test and has continued intermittently. The pain ranges from a 0/10 to a 4/10 in the mid-to-lower abdomen. Her vaginal bleeding started yesterday and is described as pink to brown when she wipes. Denies sick contacts. She has some nausea but denies vomiting, diarrhea, or constipation. States she has difficulty passing gas. Patient endorses upper back pain that started over a week ago when she was moving furniture. No CVA tenderness.   OB History    Gravida  2   Para  1   Term  1   Preterm      AB      Living  1     SAB      TAB      Ectopic      Multiple      Live Births  1           Past Medical History:  Diagnosis Date   Anxiety    Asthma    Depression    Gestational diabetes mellitus (GDM)    Obesity    Preeclampsia     Past Surgical History:  Procedure Laterality Date   CESAREAN SECTION     TONSILLECTOMY      Family History  Problem Relation Age of Onset   Diabetes Mother    Depression Father    Hypertension Father    Anxiety disorder Father     Social History   Tobacco Use   Smoking status: Former Smoker    Types: Cigarettes    Quit date: 05/04/2016    Years since quitting: 3.8   Smokeless tobacco: Never Used  Vaping Use   Vaping Use: Never used  Substance Use Topics   Alcohol use: Not Currently    Comment: none + UPT   Drug use: Not Currently    Types: Marijuana    Comment: a month ago    Allergies: No Known Allergies  No medications prior to admission.    Review of Systems  Gastrointestinal: Positive for abdominal  pain and nausea.  Genitourinary: Positive for vaginal bleeding and vaginal discharge.  Musculoskeletal: Positive for back pain.  All other systems reviewed and are negative.  Physical Exam   Blood pressure 117/68, pulse 67, temperature 98.3 F (36.8 C), resp. rate 18, height 5' (1.524 m), weight 97.1 kg, last menstrual period 02/02/2020.  Physical Exam Vitals and nursing note reviewed. Exam conducted with a chaperone present.  Constitutional:      General: She is not in acute distress.    Appearance: She is obese.  Cardiovascular:     Rate and Rhythm: Normal rate and regular rhythm.     Pulses: Normal pulses.     Heart sounds: Normal heart sounds. No murmur heard.   Pulmonary:     Effort: Pulmonary effort is normal.     Breath sounds: Normal breath sounds. No wheezing.  Abdominal:     General: Bowel sounds are normal.     Palpations: Abdomen is soft.     Tenderness: There is abdominal tenderness in the left upper quadrant. There  is no right CVA tenderness, left CVA tenderness or guarding.  Genitourinary:    General: Normal vulva.     Cervix: Discharge (Dark red blood + mucus) and cervical bleeding present.     Uterus: Normal.      Adnexa:        Right: No tenderness.         Left: No tenderness.       Comments: NEFG; cervix is closed, long, posterior.  No CMT. Dark brown blood and mucous extruding from the os.  Skin:    General: Skin is warm and dry.     Capillary Refill: Capillary refill takes less than 2 seconds.  Neurological:     Mental Status: She is alert and oriented to person, place, and time.     MAU & MDM Course   -Ectopic pregnancy workup: CBC, CMP, ABO-Rh, ultrasound, beta-hCG -Simethicone administered for gas relief  -Speculum exam, GC and wet prep collected   Assessment and Plan  29 y.o. female with vaginal bleeding and cramping in early pregnancy.   #Pregnancy of unknown anatomic location. -Follow up beta-hCG quant at [facility] in 48  hours -Strict return precautions for increased bleeding or severe pain  Ginger Organ 03/14/2020, 12:24 PM    CNM attestation:  I have seen and examined this patient and agree with above documentation in the PA's note.   Brooke Robertson is a 28 y.o. G2P1001 at [redacted]w[redacted]d reporting reporting pain and bleeding. She reports intermittent cramping that started last week, has been coming and going. She reports some intermittent bleeding that started yesterday. She denies contractions,  SOB, fever, constipation, diarrhea, chest pain.   PE: Patient Vitals for the past 24 hrs:  BP Temp Pulse Resp Height Weight  03/14/20 0852 117/68 98.3 F (36.8 C) 67 18 5' (1.524 m) 97.1 kg   Gen: calm comfortable, NAD Resp: normal effort, no distress Heart: Regular rate Abd: Soft, NT,  ROS, labs, PMH reviewed  Orders Placed This Encounter  Procedures   Wet prep, genital   US OB Comp Less 14 Wks   US OB Transvaginal   Urinalysis, Routine w reflex microscopic Urine, Clean Catch   CBC   Comprehensive metabolic panel   hCG, quantitative, pregnancy   Discharge patient Discharge disposition: 01-Home or Self Care; Discharge patient date: 03/14/2020   Meds ordered this encounter  Medications   simethicone (MYLICON) chewable tablet 80 mg    MDM -complete ectopic work up done; I have independently reviewed the [place] images, which reveal finding of IUGS but no YS.  -Beta is 239  Today    Assessment: 1. Pregnancy of unknown anatomic location   2. Vaginal bleeding     Plan: - Discharge home in stable condition. - Strict ectopic and bleeding precautions reviewed.  -Follow up quant at [facility], patient knows that she will have to be available to discuss results in two hours.  -Discussed  Ectopic pregnancy vs. Miscarriage vs. Normal pregnancy.  - Return to maternity admissions symptoms worsen  Marylene Land, CNM 03/14/2020 1:03 PM

## 2020-03-14 NOTE — Discharge Instructions (Signed)
Ectopic Pregnancy ° °An ectopic pregnancy happens when a fertilized egg grows outside the womb (uterus). The fertilized egg cannot stay alive outside of the womb. This problem often happens in a fallopian tube. It is often caused by damage to the tube. °If this problem is found early, you may be treated with medicine that stops the egg from growing. If your tube tears or bursts open (ruptures), you will bleed inside. Often, there is very bad pain in the lower belly. This is an emergency. You will need surgery. Get help right away. °Follow these instructions at home: °After being treated with medicine or surgery: °· Rest and limit your activity for as long as told by your doctor. °· Until your doctor says that it is safe: °? Do not lift anything that is heavier than 10 lb (4.5 kg) or the limit that your doctor tells you. °? Avoid exercise and any movement that takes a lot of effort. °· To prevent problems when pooping (constipation): °? Eat a healthy diet. This includes: °§ Fruits. °§ Vegetables. °§ Whole grains. °? Drink 6-8 glasses of water a day. °Contact a doctor if: °Get help right away if: °· You have sudden and very bad pain in your belly. °· You have very bad pain in your shoulders or neck. °· You have pain that gets worse and is not helped by medicine. °· You have: °? A fever or chills. °? Vaginal bleeding. °? Redness or swelling at the site of a surgical cut (incision). °· You feel sick to your stomach (nauseous) or you throw up (vomit). °· You feel dizzy or weak. °· You feel light-headed or you pass out (faint). °Summary °· An ectopic pregnancy happens when a fertilized egg grows outside the womb (uterus). °· If this problem is found early, you may be treated with medicine that stops the egg from growing. °· If your tube tears or bursts open (ruptures), you will need surgery. This is an emergency. Get help right away. °This information is not intended to replace advice given to you by your health care  provider. Make sure you discuss any questions you have with your health care provider. °Document Revised: 05/15/2017 Document Reviewed: 06/26/2016 °Elsevier Patient Education © 2020 [employer] ° °

## 2020-03-14 NOTE — MAU Note (Signed)
Pt stated she had a positive pregnancy test at urgent care. Pt reports she has had some mild camping and a brownish discharge when she wipes x 2 days.

## 2020-03-15 LAB — GC/CHLAMYDIA PROBE AMP (~~LOC~~) NOT AT ARMC
Chlamydia: NEGATIVE
Comment: NEGATIVE
Comment: NORMAL
Neisseria Gonorrhea: NEGATIVE

## 2020-03-16 ENCOUNTER — Other Ambulatory Visit: Payer: Self-pay

## 2020-03-16 ENCOUNTER — Encounter (HOSPITAL_COMMUNITY): Payer: Self-pay | Admitting: Obstetrics and Gynecology

## 2020-03-16 ENCOUNTER — Other Ambulatory Visit (INDEPENDENT_AMBULATORY_CARE_PROVIDER_SITE_OTHER): Payer: Medicaid Other | Admitting: *Deleted

## 2020-03-16 ENCOUNTER — Inpatient Hospital Stay (HOSPITAL_COMMUNITY)
Admission: AD | Admit: 2020-03-16 | Discharge: 2020-03-16 | Disposition: A | Discharge status: Home / Self Care | Payer: Medicaid Other | Attending: Obstetrics and Gynecology | Admitting: Obstetrics and Gynecology

## 2020-03-16 ENCOUNTER — Inpatient Hospital Stay (HOSPITAL_COMMUNITY): Payer: Medicaid Other

## 2020-03-16 DIAGNOSIS — O99211 Obesity complicating pregnancy, first trimester: Secondary | ICD-10-CM | POA: Insufficient documentation

## 2020-03-16 DIAGNOSIS — Z3A01 Less than 8 weeks gestation of pregnancy: Secondary | ICD-10-CM | POA: Insufficient documentation

## 2020-03-16 DIAGNOSIS — Z87891 Personal history of nicotine dependence: Secondary | ICD-10-CM | POA: Insufficient documentation

## 2020-03-16 DIAGNOSIS — O209 Hemorrhage in early pregnancy, unspecified: Secondary | ICD-10-CM | POA: Diagnosis not present

## 2020-03-16 DIAGNOSIS — O3680X0 Pregnancy with inconclusive fetal viability, not applicable or unspecified: Secondary | ICD-10-CM

## 2020-03-16 DIAGNOSIS — N939 Abnormal uterine and vaginal bleeding, unspecified: Secondary | ICD-10-CM

## 2020-03-16 DIAGNOSIS — Z8759 Personal history of other complications of pregnancy, childbirth and the puerperium: Secondary | ICD-10-CM | POA: Diagnosis not present

## 2020-03-16 DIAGNOSIS — E669 Obesity, unspecified: Secondary | ICD-10-CM | POA: Diagnosis not present

## 2020-03-16 LAB — CBC
HCT: 41.4 % (ref 36.0–46.0)
Hemoglobin: 14.2 g/dL (ref 12.0–15.0)
MCH: 31.7 pg (ref 26.0–34.0)
MCHC: 34.3 g/dL (ref 30.0–36.0)
MCV: 92.4 fL (ref 80.0–100.0)
Platelets: 352 10*3/uL (ref 150–400)
RBC: 4.48 MIL/uL (ref 3.87–5.11)
RDW: 12.6 % (ref 11.5–15.5)
WBC: 8 10*3/uL (ref 4.0–10.5)
nRBC: 0 % (ref 0.0–0.2)

## 2020-03-16 LAB — BETA HCG QUANT (REF LAB): hCG Quant: 208 m[IU]/mL

## 2020-03-16 NOTE — Progress Notes (Signed)
   Ms. Brooke Robertson presents to [facility] for follow-up quant hCG blood draw today. She was seen in MAU for vaginal bleeding and abd pain. Patient endorses abd pain and bleeding today. Discussed with patient, we are following hCG levels today. Results will be back in approximately 2-3 hours. Valid contact number for patient confirmed. I will call the patient with results.   Results and patient history reviewed with Thressa Sheller, CNM. Patient called and informed of plan for follow-up. MAU in 48 hours (Sunday 03/18/2020) for repeat beta Hcg.  Patient to return to MAU for worsen abd pain or increased vaginal bleeding.    Clovis Pu 03/16/2020 8:11 AM

## 2020-03-16 NOTE — Discharge Instructions (Signed)
-  Return to MAU on 03/18/2020 in the morning for follow up blood work -Return to MAU if bleeding becomes much heavier or pain increases

## 2020-03-16 NOTE — MAU Note (Signed)
Was here 2 days ago, with brownish d/c.  Today started bleeding red. Is now on 2nd pad.  HCG level has been dropping.  Was to return on Sunday for another draw.

## 2020-03-16 NOTE — MAU Provider Note (Signed)
Patient Brooke Robertson is a 29 y.o. G2P1001  at [redacted]w[redacted]d here with complaints of increased vaginal bleeding. She reports that her pain has increased as well. She was seen at [facility] on 03/14/2020 for vaginal bleeding, she was discharged home with a pregnancy of unknown anatomic location.   Her follow up plan was bHCG at [facility] in 48 hours, morning of October 10. Beta today at [facility] is 208, drop from 239 on Wednesday. She was advised to have follow up bHCG in MAU on Sunday, 03/18/2020 or return to MAU if her bleeding worsened or pain worsened.  History     CSN: [id number]  Arrival date and time: 03/16/20 1529   First Provider Initiated Contact with Patient 03/16/20 1625      Chief Complaint  Patient presents with  . Vaginal Bleeding   Vaginal Bleeding The patient's primary symptoms include vaginal bleeding. The current episode started in the past 7 days. The problem occurs intermittently. The problem has been gradually worsening. Pertinent negatives include no back pain, constipation, diarrhea, dysuria, fever, nausea or vomiting. The vaginal discharge was bloody. The vaginal bleeding is typical of menses. She has not been passing clots. She has not been passing tissue.    OB History    Gravida  2   Para  1   Term  1   Preterm      AB      Living  1     SAB      TAB      Ectopic      Multiple      Live Births  1        Obstetric Comments  Failure to dilate        Past Medical History:  Diagnosis Date  . Anxiety   . Asthma   . Depression   . Gestational diabetes mellitus (GDM)   . Obesity   . Preeclampsia     Past Surgical History:  Procedure Laterality Date  . CESAREAN SECTION    . TONSILLECTOMY      Family History  Problem Relation Age of Onset  . Diabetes Mother   . Depression Father   . Hypertension Father   . Anxiety disorder Father   . Heart disease Father     Social History   Tobacco Use  . Smoking status: Former Smoker     Types: Cigarettes    Quit date: 05/04/2016    Years since quitting: 3.8  . Smokeless tobacco: Never Used  Vaping Use  . Vaping Use: Never used  Substance Use Topics  . Alcohol use: Not Currently    Comment: none + UPT  . Drug use: Not Currently    Types: Marijuana    Comment: a month ago    Allergies: No Known Allergies  No medications prior to admission.    Review of Systems  Constitutional: Negative.  Negative for fever.  HENT: Negative.   Eyes: Negative.   Respiratory: Negative.   Cardiovascular: Negative.   Gastrointestinal: Negative for constipation, diarrhea, nausea and vomiting.  Genitourinary: Positive for vaginal bleeding. Negative for dysuria.  Musculoskeletal: Negative for back pain.  Neurological: Negative.    Physical Exam   Blood pressure 130/85, pulse 69, temperature 98.9 F (37.2 C), temperature source Oral, resp. rate 17, height 5' (1.524 m), weight 97.3 kg, last menstrual period 02/02/2020, SpO2 100 %.  Physical Exam Constitutional:      Appearance: Normal appearance.  HENT:     Head:  Normocephalic.     Mouth/Throat:     Mouth: Mucous membranes are moist.  Pulmonary:     Effort: Pulmonary effort is normal.  Abdominal:     General: Abdomen is flat.  Genitourinary:    General: Normal vulva.     Vagina: Vaginal discharge present.     Comments: NEFG; bright red blood in the vagina removed with Faux saw; no active bleeding or clots, cervix is pink, non-tender, closed, no CMT>  Neurological:     Mental Status: She is alert.     MAU Course  Procedures  MDM -CBC is stable -US shows SIUS, not yet definitive for miscarriage. I have independently reviewed the [place] images, which reveal finding of IUGS.  -beta today in clinic 208, slight drop from 239  Assessment and Plan   1. Pregnancy of unknown anatomic location   2. Vaginal bleeding    -Advised patient that she is most likely having a miscarriage, given drop in beta and increase in bleeding,  however, not definitive miscarriage and ectopic pregnancy not ruled out yet.   -Patient to return to MAU on 03/18/2020 for follow up bHCG. She was given strict ectopic precautions (pain, bleeding).    Charlesetta Garibaldi Adalynd Donahoe 03/16/2020, 5:40 PM

## 2020-03-18 ENCOUNTER — Inpatient Hospital Stay (HOSPITAL_COMMUNITY)
Admission: AD | Admit: 2020-03-18 | Discharge: 2020-03-18 | Disposition: A | Discharge status: Home / Self Care | Payer: Medicaid Other | Attending: Obstetrics & Gynecology | Admitting: Obstetrics & Gynecology

## 2020-03-18 ENCOUNTER — Encounter (HOSPITAL_COMMUNITY): Payer: Self-pay | Admitting: Obstetrics & Gynecology

## 2020-03-18 ENCOUNTER — Inpatient Hospital Stay (HOSPITAL_COMMUNITY): Payer: Medicaid Other

## 2020-03-18 ENCOUNTER — Other Ambulatory Visit: Payer: Self-pay

## 2020-03-18 DIAGNOSIS — Z87891 Personal history of nicotine dependence: Secondary | ICD-10-CM | POA: Insufficient documentation

## 2020-03-18 DIAGNOSIS — O209 Hemorrhage in early pregnancy, unspecified: Secondary | ICD-10-CM | POA: Insufficient documentation

## 2020-03-18 DIAGNOSIS — O99211 Obesity complicating pregnancy, first trimester: Secondary | ICD-10-CM | POA: Diagnosis not present

## 2020-03-18 DIAGNOSIS — F329 Major depressive disorder, single episode, unspecified: Secondary | ICD-10-CM | POA: Diagnosis not present

## 2020-03-18 DIAGNOSIS — O3680X0 Pregnancy with inconclusive fetal viability, not applicable or unspecified: Secondary | ICD-10-CM | POA: Diagnosis not present

## 2020-03-18 DIAGNOSIS — O99511 Diseases of the respiratory system complicating pregnancy, first trimester: Secondary | ICD-10-CM | POA: Diagnosis not present

## 2020-03-18 DIAGNOSIS — O99341 Other mental disorders complicating pregnancy, first trimester: Secondary | ICD-10-CM | POA: Diagnosis not present

## 2020-03-18 DIAGNOSIS — Z3A01 Less than 8 weeks gestation of pregnancy: Secondary | ICD-10-CM | POA: Insufficient documentation

## 2020-03-18 DIAGNOSIS — R102 Pelvic and perineal pain: Secondary | ICD-10-CM

## 2020-03-18 DIAGNOSIS — F419 Anxiety disorder, unspecified: Secondary | ICD-10-CM | POA: Diagnosis not present

## 2020-03-18 DIAGNOSIS — R109 Unspecified abdominal pain: Secondary | ICD-10-CM

## 2020-03-18 DIAGNOSIS — J45909 Unspecified asthma, uncomplicated: Secondary | ICD-10-CM | POA: Insufficient documentation

## 2020-03-18 DIAGNOSIS — O26891 Other specified pregnancy related conditions, first trimester: Secondary | ICD-10-CM

## 2020-03-18 DIAGNOSIS — O4691 Antepartum hemorrhage, unspecified, first trimester: Secondary | ICD-10-CM

## 2020-03-18 LAB — HCG, QUANTITATIVE, PREGNANCY: hCG, Beta Chain, Quant, S: 270 m[IU]/mL — ABNORMAL HIGH (ref <5)

## 2020-03-18 NOTE — MAU Note (Signed)
Pt reports to mau for repeat lab work but states her bleeding has increased since she was here 2 days ago.  Pt reports she has changed her pad 3 times since 0500 this am.  Pt denies pain at this time but states she has had some mild cramping on and off.

## 2020-03-18 NOTE — Discharge Instructions (Signed)
Return to MAU:  If you have heavier bleeding that soaks through more that 2 pads per hour for an hour or more  If you bleed so much that you feel like you might pass out or you do pass out  If you have significant abdominal pain that is not improved with Tylenol 1000 mg every 6 hours as needed for pain  If you develop a fever > 100.5   

## 2020-03-18 NOTE — MAU Provider Note (Signed)
History     CSN: [id number]  Arrival date and time: 03/18/20 8115   First Provider Initiated Contact with Patient 03/18/20 1004      Chief Complaint  Patient presents with  . Vaginal Bleeding   Ms. Brooke Robertson is a 29 y.o. year old G85P1001 female at [redacted]w[redacted]d weeks gestation who presents to MAU for repeat HCG. She was seen Friday 03/16/2020 for VB and abdominal pain. Her HCG level was done on 9/29 = 239 and 10/1 = 208. The U/S done on 03/16/20 showed an IUGS with no yolk sac, fetal pole or cardiac activity.  She reports "heavier bleeding" today and would like a pelvic exam.   OB History    Gravida  2   Para  1   Term  1   Preterm      AB      Living  1     SAB      TAB      Ectopic      Multiple      Live Births  1        Obstetric Comments  Failure to dilate        Past Medical History:  Diagnosis Date  . Anxiety   . Asthma   . Depression   . Gestational diabetes mellitus (GDM)   . Obesity   . Preeclampsia     Past Surgical History:  Procedure Laterality Date  . CESAREAN SECTION    . TONSILLECTOMY      Family History  Problem Relation Age of Onset  . Diabetes Mother   . Depression Father   . Hypertension Father   . Anxiety disorder Father   . Heart disease Father     Social History   Tobacco Use  . Smoking status: Former Smoker    Types: Cigarettes    Quit date: 05/04/2016    Years since quitting: 3.8  . Smokeless tobacco: Never Used  Vaping Use  . Vaping Use: Never used  Substance Use Topics  . Alcohol use: Not Currently    Comment: none + UPT  . Drug use: Not Currently    Types: Marijuana    Comment: a month ago    Allergies: No Known Allergies  No medications prior to admission.    Review of Systems  Constitutional: Negative.   HENT: Negative.   Eyes: Negative.   Respiratory: Negative.   Cardiovascular: Negative.   Gastrointestinal: Negative.   Endocrine: Negative.   Genitourinary: Positive for vaginal bleeding  (heavier).  Musculoskeletal: Negative.   Skin: Negative.   Allergic/Immunologic: Negative.   Neurological: Negative.   Hematological: Negative.   Psychiatric/Behavioral: Negative.    Physical Exam   Blood pressure 133/76, pulse 61, temperature 98.3 F (36.8 C), temperature source Oral, resp. rate 16, last menstrual period 02/02/2020, SpO2 99 %.  Physical Exam Vitals and nursing note reviewed. Exam conducted with a chaperone present.  Constitutional:      Appearance: Normal appearance. She is obese.  HENT:     Head: Normocephalic and atraumatic.     Nose: Nose normal.  Cardiovascular:     Rate and Rhythm: Normal rate.     Pulses: Normal pulses.  Pulmonary:     Effort: Pulmonary effort is normal.  Genitourinary:    Comments: Uterus: non-tender, SE: cervix is smooth, pink, no lesions, small amt of dark, red blood with clots - blood cleared out with large cotton swabs and ?POC removed from cervical os with Ring  forcep - will hold to send to pathology if no IUGS seen on U/S, closed/long/firm, no CMT or friability, no adnexal tenderness  Musculoskeletal:     Cervical back: Normal range of motion.  Neurological:     Mental Status: She is alert.     MAU Course  Procedures  MDM HCG Speculum Exam OB U/S< 14 wks Progesterone level, serum  Results for orders placed or performed during the hospital encounter of 03/18/20 (from the past 24 hour(s))  hCG, quantitative, pregnancy     Status: Abnormal   Collection Time: 03/18/20  9:14 AM  Result Value Ref Range   hCG, Beta Chain, Quant, S 270 (H) <5 mIU/mL    US OB Transvaginal  Result Date: 03/18/2020 CLINICAL DATA:  Vaginal bleeding. EXAM: TRANSVAGINAL OB ULTRASOUND TECHNIQUE: Transvaginal ultrasound was performed for complete evaluation of the gestation as well as the maternal uterus, adnexal regions, and pelvic cul-de-sac. COMPARISON:  None. FINDINGS: Intrauterine gestational sac: Single Yolk sac:  Not Visualized. Embryo:  Not  Visualized. Cardiac Activity: Not Visualized. MSD: 5.3 mm   5 w   2 d Subchorionic hemorrhage:  None visualized. Maternal uterus/adnexae: Normal in appearance.  No free fluid. IMPRESSION: Probable early intrauterine gestational sac, but no yolk sac, fetal pole, or cardiac activity yet visualized. Recommend follow-up quantitative B-HCG levels and follow-up US in 14 days to assess viability. This recommendation follows SRU consensus guidelines: Diagnostic Criteria for Nonviable Pregnancy Early in the First Trimester. Malva Limes Med 2013; 027:2536-64. Electronically Signed   By: Emmaline Kluver M.D.   On: 03/18/2020 11:00    *Consult with Dr. Mayford Knife @ 1250 - notified of patient's complaints, assessments, lab & U/S results, recommended tx plan progesterone level and repeat HCG level in 48 hrs - ok to d/c home Assessment and Plan  Pregnancy of unknown anatomic location  - Information provided on ectopic pregnancy   Bleeding in early pregnancy  - Information provided on VB in pregnancy  - Return to MAU:  If you have heavier bleeding that soaks through more that 2 pads per hour for an hour or more  If you bleed so much that you feel like you might pass out or you do pass out  If you have significant abdominal pain that is not improved with Tylenol 1000 mg every 6 hours as needed for pain  If you develop a fever > 100.5   Abdominal cramping affecting pregnancy - Ok to take Tylenol 1000 mg every 6 hours prn pain  - Discharge home - F/U lab appt on Tuesday 03/20/20 at 1050 at [facility] - Patient verbalized an understanding of the plan of care and agrees.   Raelyn Mora, MSN, CNM 03/18/2020, 10:04 AM

## 2020-03-19 LAB — PROGESTERONE: Progesterone: 6.9 ng/mL

## 2020-03-20 ENCOUNTER — Ambulatory Visit (INDEPENDENT_AMBULATORY_CARE_PROVIDER_SITE_OTHER): Payer: Medicaid Other | Admitting: *Deleted

## 2020-03-20 ENCOUNTER — Telehealth: Payer: Self-pay | Admitting: Obstetrics and Gynecology

## 2020-03-20 ENCOUNTER — Inpatient Hospital Stay (HOSPITAL_COMMUNITY)
Admission: AD | Admit: 2020-03-20 | Discharge: 2020-03-20 | Disposition: A | Discharge status: Home / Self Care | Payer: Medicaid Other | Attending: Obstetrics and Gynecology | Admitting: Obstetrics and Gynecology

## 2020-03-20 ENCOUNTER — Other Ambulatory Visit: Payer: Medicaid Other

## 2020-03-20 ENCOUNTER — Other Ambulatory Visit: Payer: Self-pay

## 2020-03-20 DIAGNOSIS — O009 Unspecified ectopic pregnancy without intrauterine pregnancy: Secondary | ICD-10-CM | POA: Insufficient documentation

## 2020-03-20 DIAGNOSIS — O0281 Inappropriate change in quantitative human chorionic gonadotropin (hCG) in early pregnancy: Secondary | ICD-10-CM | POA: Diagnosis not present

## 2020-03-20 DIAGNOSIS — O3680X0 Pregnancy with inconclusive fetal viability, not applicable or unspecified: Secondary | ICD-10-CM

## 2020-03-20 DIAGNOSIS — Z3A01 Less than 8 weeks gestation of pregnancy: Secondary | ICD-10-CM | POA: Diagnosis not present

## 2020-03-20 LAB — COMPREHENSIVE METABOLIC PANEL
ALT: 11 U/L (ref 0–44)
AST: 18 U/L (ref 15–41)
Albumin: 3.6 g/dL (ref 3.5–5.0)
Alkaline Phosphatase: 40 U/L (ref 38–126)
Anion gap: 11 (ref 5–15)
BUN: 12 mg/dL (ref 6–20)
CO2: 21 mmol/L — ABNORMAL LOW (ref 22–32)
Calcium: 9.2 mg/dL (ref 8.9–10.3)
Chloride: 105 mmol/L (ref 98–111)
Creatinine, Ser: 0.75 mg/dL (ref 0.44–1.00)
GFR calc non Af Amer: >60 mL/min (ref >60)
Glucose, Bld: 107 mg/dL — ABNORMAL HIGH (ref 70–99)
Potassium: 3.8 mmol/L (ref 3.5–5.1)
Sodium: 137 mmol/L (ref 135–145)
Total Bilirubin: 0.3 mg/dL (ref 0.3–1.2)
Total Protein: 7.2 g/dL (ref 6.5–8.1)

## 2020-03-20 LAB — BETA HCG QUANT (REF LAB): hCG Quant: 279 m[IU]/mL

## 2020-03-20 MED ORDER — METHOTREXATE FOR ECTOPIC PREGNANCY
50.0000 mg/m2 | Freq: Once | INTRAMUSCULAR | Status: AC
  Administered 2020-03-20: 23:00:00 100 mg via INTRAMUSCULAR
  Filled 2020-03-20: qty 1

## 2020-03-20 NOTE — MAU Note (Signed)
.   Brooke Robertson is a 29 y.o. at [redacted]w[redacted]d here in MAU reporting: was seen in the office today for repeat HCG and her levels were rising abnormally so they sent her to be evaluated for ectopic pregnancy. Patient endorses vaginal spotting. Last [place] was when she was seen in MAU.   Pain score: 0 Vitals:   03/20/20 2050  BP: 120/67  Pulse: 76  Resp: 15  Temp: 99 F (37.2 C)  SpO2: 100%

## 2020-03-20 NOTE — Discharge Instructions (Signed)
Methotrexate Treatment for an Ectopic Pregnancy, Care After This sheet gives you information about how to care for yourself after your procedure. Your health care provider may also give you more specific instructions. If you have problems or questions, contact your health care provider. What can I expect after the procedure? After the procedure, it is common to have:  Abdominal cramping.  Vaginal bleeding.  Fatigue.  Nausea.  Vomiting.  Diarrhea. Blood tests will be taken at timed intervals for several days or weeks to check your pregnancy hormone levels. The blood tests will be done until the pregnancy hormone can no longer be detected in the blood. Follow these instructions at home: Activity  Do not have sex until your health care provider approves.  Limit activities that take a lot of effort as told by your health care provider. Medicines  Take over the counter and prescription medicines only as told by your health care provider.  Do not take aspirin, ibuprofen, naproxen, or any other NSAIDs.  Do not take folic acid, prenatal vitamins, or other vitamins that contain folic acid. General instructions   Do not drink alcohol.  Follow instructions from your health care provider on how and when to report any symptoms that may indicate a ruptured ectopic pregnancy.  Keep all follow-up visits as told by your health care provider. This is important. Contact a health care provider if:  You have persistent nausea and vomiting.  You have persistent diarrhea.  You are having a reaction to the medicine, such as: ? Tiredness. ? Skin rash. ? Hair loss. Get help right away if:  Your abdominal or pelvic pain gets worse.  You have more vaginal bleeding.  You feel light-headed or you faint.  You have shortness of breath.  Your heart rate increases.  You develop a cough.  You have chills.  You have a fever. Summary  After the procedure, it is common to have symptoms  of abdominal cramping, vaginal bleeding and fatigue. You may also experience other symptoms.  Blood tests will be taken at timed intervals for several days or weeks to check your pregnancy hormone levels. The blood tests will be done until the pregnancy hormone can no longer be detected in the blood.  Limit strenuous activity as told by your health care provider.  Follow instructions from your health care provider on how and when to report any symptoms that may indicate a ruptured ectopic pregnancy. This information is not intended to replace advice given to you by your health care provider. Make sure you discuss any questions you have with your health care provider. Document Revised: 05/15/2017 Document Reviewed: 07/22/2016 Elsevier Patient Education  2020 Elsevier Inc.       Ectopic Pregnancy  An ectopic pregnancy is when the fertilized egg attaches (implants) outside the uterus. Most ectopic pregnancies occur in one of the tubes where eggs travel from the ovary to the uterus (fallopian tubes), but the implanting can occur in other locations. In rare cases, ectopic pregnancies occur on the ovary, intestine, pelvis, abdomen, or cervix. In an ectopic pregnancy, the fertilized egg does not have the ability to develop into a normal, healthy baby. A ruptured ectopic pregnancy is one in which tearing or bursting of a fallopian tube causes internal bleeding. Often, there is intense lower abdominal pain, and vaginal bleeding sometimes occurs. Having an ectopic pregnancy can be life-threatening. If this dangerous condition is not treated, it can lead to blood loss, shock, or even death. What are the causes?   The most common cause of this condition is damage to one of the fallopian tubes. A fallopian tube may be narrowed or blocked, and that keeps the fertilized egg from reaching the uterus. What increases the risk? This condition is more likely to develop in women of childbearing age who have  different levels of risk. The levels of risk can be divided into three categories. High risk  You have gone through infertility treatment.  You have had an ectopic pregnancy before.  You have had surgery on the fallopian tubes, or another surgical procedure, such as an abortion.  You have had surgery to have the fallopian tubes tied (tubal ligation).  You have problems or diseases of the fallopian tubes.  You have been exposed to diethylstilbestrol (DES). This medicine was used until 1971, and it had effects on babies whose mothers took the medicine.  You become pregnant while using an IUD (intrauterine device) for birth control. Moderate risk  You have a history of infertility.  You have had an STI (sexually transmitted infection).  You have a history of pelvic inflammatory disease (PID).  You have scarring from endometriosis.  You have multiple sexual partners.  You smoke. Low risk  You have had pelvic surgery.  You use vaginal douches.  You became sexually active before age 77. What are the signs or symptoms? Common symptoms of this condition include normal pregnancy symptoms, such as missing a period, nausea, tiredness, abdominal pain, breast tenderness, and bleeding. However, ectopic pregnancy will have additional symptoms, such as:  Pain with intercourse.  Irregular vaginal bleeding or spotting.  Cramping or pain on one side or in the lower abdomen.  Fast heartbeat, low blood pressure, and sweating.  Passing out while having a bowel movement. Symptoms of a ruptured ectopic pregnancy and internal bleeding may include:  Sudden, severe pain in the abdomen and pelvis.  Dizziness, weakness, light-headedness, or fainting.  Pain in the shoulder or neck area. How is this diagnosed? This condition is diagnosed by:  A pelvic exam to locate pain or a mass in the abdomen.  A pregnancy test. This blood test checks for the presence as well as the specific level of  pregnancy hormone in the bloodstream.  Ultrasound. This is performed if a pregnancy test is positive. In this test, a probe is inserted into the vagina. The probe will detect a fetus, possibly in a location other than the uterus.  Taking a sample of uterus tissue (dilation and curettage, or D&C).  Surgery to perform a visual exam of the inside of the abdomen using a thin, lighted tube that has a tiny camera on the end (laparoscope).  Culdocentesis. This procedure involves inserting a needle at the top of the vagina, behind the uterus. If blood is present in this area, it may indicate that a fallopian tube is torn. How is this treated? This condition is treated with medicine or surgery. Medicine  An injection of a medicine (methotrexate) may be given to cause the pregnancy tissue to be absorbed. This medicine may save your fallopian tube. It may be given if: ? The diagnosis is made early, with no signs of active bleeding. ? The fallopian tube has not ruptured. ? You are considered to be a good candidate for the medicine. Usually, pregnancy hormone blood levels are checked after methotrexate treatment. This is to be sure that the medicine is effective. It may take 4-6 weeks for the pregnancy to be absorbed. Most pregnancies will be absorbed by 3 weeks.  Surgery  A laparoscope may be used to remove the pregnancy tissue.  If severe internal bleeding occurs, a larger cut (incision) may be made in the lower abdomen (laparotomy) to remove the fetus and placenta. This is done to stop the bleeding.  Part or all of the fallopian tube may be removed (salpingectomy) along with the fetus and placenta. The fallopian tube may also be repaired during the surgery.  In very rare circumstances, removal of the uterus (hysterectomy) may be required.  After surgery, pregnancy hormone testing may be done to be sure that there is no pregnancy tissue left. Whether your treatment is medicine or surgery, you may  receive a Rho (D) immune globulin shot to prevent problems with any future pregnancy. This shot may be given if:  You are Rh-negative and the baby's father is Rh-positive.  You are Rh-negative and you do not know the Rh type of the baby's father. Follow these instructions at home:  Rest and limit your activity after the procedure for as long as told by your health care provider.  Until your health care provider says that it is safe: ? Do not lift anything that is heavier than 10 lb (4.5 kg), or the limit that your health care provider tells you. ? Avoid physical exercise and any movement that requires effort (is strenuous).  To help prevent constipation: ? Eat a healthy diet that includes fruits, vegetables, and whole grains. ? Drink 6-8 glasses of water per day. Get help right away if:  You develop worsening pain that is not relieved by medicine.  You have: ? A fever or chills. ? Vaginal bleeding. ? Redness and swelling at the incision site. ? Nausea and vomiting.  You feel dizzy or weak.  You feel light-headed or you faint. This information is not intended to replace advice given to you by your health care provider. Make sure you discuss any questions you have with your health care provider. Document Revised: 05/15/2017 Document Reviewed: 01/02/2016 Elsevier Patient Education  2020 [employer].

## 2020-03-20 NOTE — Progress Notes (Signed)
   Ms. Brooke Robertson presents to [facility] for follow-up quant hCG blood draw today. She was seen in MAU for abdominal pain and vaginal bleeding on 03/14/20. Beta Hcg on 03/14/20: 239; 10/1/2: 208 and 03/18/20: 270. Patient denies abdominal pain and endorses vaginal bleeding today. Discussed with patient, we are following hCG levels today. Results will be back in approximately 2 hours. Valid contact number for patient confirmed. I will call the patient with results.   Results and patient history reviewed with Raelyn Mora, CNM. Patient called and informed of plan for follow-up. See note from Brantley Fling., CNM    Clovis Pu 03/20/2020 11:59 AM

## 2020-03-20 NOTE — MAU Provider Note (Signed)
Brooke Robertson  is a 29 y.o. G2P1001  at [redacted]w[redacted]d who presents to MAU today for methotrexate. Patient was being followed for pregnancy of unknown location & has had multiple inappropriate HCGs (239, 208, 270, 279). Ultrasound done last week showed no IUP or adnexal mass. Has had some vaginal spotting but denies abdominal pain.  Decision was made to proceed with methotrexate after [facility] consulted with Dr. Donavan Foil.   BP 120/67 (BP Location: Right Arm)    Pulse 76    Temp 99 F (37.2 C) (Oral)    Resp 15    Ht 5' (1.524 m)    Wt 98 kg    LMP 02/02/2020    SpO2 100%    BMI 42.18 kg/m   GENERAL: Well-developed, well-nourished female in no acute distress.  HEENT: Normocephalic, atraumatic.   LUNGS: Effort normal HEART: Regular rate  SKIN: Warm, dry and without erythema PSYCH: Normal mood and affect  Results for orders placed or performed during the hospital encounter of 03/20/20 (from the past 24 hour(s))  Comprehensive metabolic panel     Status: Abnormal   Collection Time: 03/20/20  9:28 PM  Result Value Ref Range   Sodium 137 135 - 145 mmol/L   Potassium 3.8 3.5 - 5.1 mmol/L   Chloride 105 98 - 111 mmol/L   CO2 21 (L) 22 - 32 mmol/L   Glucose, Bld 107 (H) 70 - 99 mg/dL   BUN 12 6 - 20 mg/dL   Creatinine, Ser 1.61 0.44 - 1.00 mg/dL   Calcium 9.2 8.9 - 09.6 mg/dL   Total Protein 7.2 6.5 - 8.1 g/dL   Albumin 3.6 3.5 - 5.0 g/dL   AST 18 15 - 41 U/L   ALT 11 0 - 44 U/L   Alkaline Phosphatase 40 38 - 126 U/L   Total Bilirubin 0.3 0.3 - 1.2 mg/dL   GFR calc non Af Amer >60 >60 mL/min   Anion gap 11 5 - 15    A: 1. Ectopic pregnancy without intrauterine pregnancy, unspecified location  The risks of methotrexate were reviewed including failure requiring repeat dosing or eventual surgery. She understands that methotrexate involves frequent return visits to monitor lab values and that she remains at risk of ectopic rupture until her beta is less than assay. ?The patient opts to proceed with  methotrexate.  She has no history of hepatic or renal dysfunction, has normal BUN/Cr/LFT's/platelets.  She is felt to be reliable for follow-up. Side effects of photosensitivity & GI upset were discussed.  She knows to avoid direct sunlight and abstain from alcohol, aspirin and aspirin-like products for two weeks. She was counseled to discontinue any MVI with folic acid. ?She understands to follow up on D4 (Friday) and D7 (Monday) for repeat BHCG and was given the instruction sheet. Strict ectopic precautions were reviewed, the patient knows to call with any abdominal pain, vomiting, fainting, or any concerns with her health.  Rh+, no Rhogam necessary   2. Inappropriate change in quantitative hCG in early pregnancy      P: Discharge home Strict return precautions r/t ectopic Msg to [facility] for lab appointments on Friday & Monday  Judeth Horn, NP  03/20/2020 9:30 PM

## 2020-03-20 NOTE — Telephone Encounter (Addendum)
*  Consult with Dr. Donavan Foil @ [phone] - notified of patient's lab results, recommended tx plan offer MTX to patient since the HCG results are atypical and not rising at an appropriate rate  TC to patient @ 1513 to notify of HCG results from today and Dr. Laverna Peace recommendation to get MTX. Patient advised that the atypical rise in HCG is c/w ectopic pregnancy; which is life-threatening. She states she "knows a little bit about ectopic pregnancy." Advised that MTX cannot be given in the office and she will need to return to MAU for the injection. Explained there would be blood drawn and resulted prior to receiving MTX. Advised to secure childcare prior to coming to the hospital, so she will be able to stay for all steps necessary for MTX. Discussed the HCG F/U schedule. She states she will be able to come to MAU on 03/21/2020 after she drops her daughter off at school around 0830. Will notify the morning MAU provider. Patient states she will have more questions for the provider tomorrow morning.   Raelyn Mora, CNM  03/20/2020 3:36 PM

## 2020-03-23 ENCOUNTER — Other Ambulatory Visit: Payer: Self-pay

## 2020-03-23 ENCOUNTER — Other Ambulatory Visit (INDEPENDENT_AMBULATORY_CARE_PROVIDER_SITE_OTHER): Payer: Medicaid Other | Admitting: *Deleted

## 2020-03-23 DIAGNOSIS — O009 Unspecified ectopic pregnancy without intrauterine pregnancy: Secondary | ICD-10-CM

## 2020-03-23 LAB — BETA HCG QUANT (REF LAB): hCG Quant: 354 m[IU]/mL

## 2020-03-23 NOTE — Progress Notes (Addendum)
° °  Ms. Brooke Robertson presents to [facility] for follow-up quant hCG blood draw today. She was seen in MAU for  abdominal pain and vaginal bleeding  on 03/14/2020. Beta Hcg on 03/14/20: 239; 03/16/2020: 208; 03/18/2020: 270; 03/20/20: 279. Patient denies abdominal pain and endorses light vaginal bleeding today. Discussed with patient, we are following hCG levels today. Results will be back in approximately 2 hours. Valid contact number for patient confirmed. I will call the patient with results.   Results and patient history reviewed with Dr. Macon Large. Patient called and informed of plan for follow-up. Beta Hcg level today 354. Patient to have repeat Beta Hcg. Advised patient to follow up at MAU if bleeding/pain worsen.   Clovis Pu 03/23/2020 9:39 AM    Patient was assessed and managed by nursing staff during this encounter. I have reviewed the chart and agree with the documentation and plan. I have also made any necessary editorial changes.  Jaynie Collins, MD 03/23/2020 11:52 AM

## 2020-03-26 ENCOUNTER — Other Ambulatory Visit: Payer: Medicaid Other

## 2020-03-27 ENCOUNTER — Other Ambulatory Visit (INDEPENDENT_AMBULATORY_CARE_PROVIDER_SITE_OTHER): Payer: Medicaid Other | Admitting: *Deleted

## 2020-03-27 ENCOUNTER — Other Ambulatory Visit: Payer: Self-pay

## 2020-03-27 DIAGNOSIS — O009 Unspecified ectopic pregnancy without intrauterine pregnancy: Secondary | ICD-10-CM

## 2020-03-27 DIAGNOSIS — O0281 Inappropriate change in quantitative human chorionic gonadotropin (hCG) in early pregnancy: Secondary | ICD-10-CM

## 2020-03-27 LAB — BETA HCG QUANT (REF LAB): hCG Quant: 239 m[IU]/mL

## 2020-03-27 NOTE — Progress Notes (Addendum)
   Ms. Brooke Robertson presents to [facility] for follow-up quant hCG blood draw today. She was seen in MAU for abdominal pain and vaginal bleeding on 03/14/20. Patient endorses back pain and bleeding today (red with small clots) Yesterday bleeding was light pink in color. Discussed with patient, we are following hCG levels today. Results will be back in approximately 2 hours. Valid contact number for patient confirmed. I will call the patient with results.   Results and patient history reviewed with Dr. Adrian Blackwater. Patient called and informed of plan for follow-up. Per Dr. Adrian Blackwater, patient will need beta hcg in one week and follow up with provider at [facility] in 2 weeks. Advised patient to return to MAU with heavy vaginal bleeding and pain.  Clovis Pu 03/27/2020 3:39 PM

## 2020-04-02 ENCOUNTER — Other Ambulatory Visit: Payer: Self-pay

## 2020-04-02 ENCOUNTER — Other Ambulatory Visit (INDEPENDENT_AMBULATORY_CARE_PROVIDER_SITE_OTHER): Payer: Medicaid Other | Admitting: *Deleted

## 2020-04-02 DIAGNOSIS — O009 Unspecified ectopic pregnancy without intrauterine pregnancy: Secondary | ICD-10-CM

## 2020-04-02 NOTE — Progress Notes (Signed)
   Ms. Brooke Robertson presents to [facility] for follow-up quant hCG blood draw today. She was seen in MAU for abdominal pain and vaginal bleeding on 03/14/2020. Patient denies abdominal pain or bleeding today. Discussed with patient, we are following hCG levels today. Results will be back in 1-2 days. Valid contact number for patient confirmed. I will call the patient with results.    Clovis Pu 04/02/2020 11:59 AM

## 2020-04-03 ENCOUNTER — Other Ambulatory Visit: Payer: Medicaid Other

## 2020-04-03 ENCOUNTER — Other Ambulatory Visit: Payer: Self-pay | Admitting: Certified Nurse Midwife

## 2020-04-03 DIAGNOSIS — O039 Complete or unspecified spontaneous abortion without complication: Secondary | ICD-10-CM

## 2020-04-03 LAB — BETA HCG QUANT (REF LAB): hCG Quant: 59 m[IU]/mL

## 2020-04-03 NOTE — Progress Notes (Signed)
Pt needs additional bHCG trending in one week.

## 2020-04-11 ENCOUNTER — Ambulatory Visit: Payer: Medicaid Other | Admitting: Certified Nurse Midwife

## 2020-06-07 ENCOUNTER — Ambulatory Visit: Payer: Medicaid Other | Admitting: Advanced Practice Midwife

## 2020-06-12 ENCOUNTER — Emergency Department (HOSPITAL_COMMUNITY)
Admission: EM | Admit: 2020-06-12 | Discharge: 2020-06-12 | Disposition: A | Discharge status: Home / Self Care | Payer: Medicaid Other | Attending: Emergency Medicine | Admitting: Emergency Medicine

## 2020-06-12 ENCOUNTER — Encounter (HOSPITAL_COMMUNITY): Payer: Self-pay | Admitting: Emergency Medicine

## 2020-06-12 ENCOUNTER — Other Ambulatory Visit: Payer: Self-pay

## 2020-06-12 DIAGNOSIS — Z87891 Personal history of nicotine dependence: Secondary | ICD-10-CM | POA: Insufficient documentation

## 2020-06-12 DIAGNOSIS — J45909 Unspecified asthma, uncomplicated: Secondary | ICD-10-CM | POA: Diagnosis not present

## 2020-06-12 DIAGNOSIS — Z20822 Contact with and (suspected) exposure to covid-19: Secondary | ICD-10-CM | POA: Insufficient documentation

## 2020-06-12 DIAGNOSIS — R059 Cough, unspecified: Secondary | ICD-10-CM | POA: Diagnosis present

## 2020-06-12 DIAGNOSIS — B349 Viral infection, unspecified: Secondary | ICD-10-CM | POA: Diagnosis not present

## 2020-06-12 LAB — COMPREHENSIVE METABOLIC PANEL
ALT: 16 U/L (ref 0–44)
AST: 26 U/L (ref 15–41)
Albumin: 4.1 g/dL (ref 3.5–5.0)
Alkaline Phosphatase: 39 U/L (ref 38–126)
Anion gap: 10 (ref 5–15)
BUN: 6 mg/dL (ref 6–20)
CO2: 22 mmol/L (ref 22–32)
Calcium: 9.3 mg/dL (ref 8.9–10.3)
Chloride: 107 mmol/L (ref 98–111)
Creatinine, Ser: 0.71 mg/dL (ref 0.44–1.00)
GFR, Estimated: >60 mL/min (ref >60)
Glucose, Bld: 88 mg/dL (ref 70–99)
Potassium: 4.3 mmol/L (ref 3.5–5.1)
Sodium: 139 mmol/L (ref 135–145)
Total Bilirubin: 0.7 mg/dL (ref 0.3–1.2)
Total Protein: 7.5 g/dL (ref 6.5–8.1)

## 2020-06-12 LAB — CBC WITH DIFFERENTIAL/PLATELET
Abs Immature Granulocytes: 0.02 10*3/uL (ref 0.00–0.07)
Basophils Absolute: 0.1 10*3/uL (ref 0.0–0.1)
Basophils Relative: 1 %
Eosinophils Absolute: 0.2 10*3/uL (ref 0.0–0.5)
Eosinophils Relative: 5 %
HCT: 48.3 % — ABNORMAL HIGH (ref 36.0–46.0)
Hemoglobin: 15.7 g/dL — ABNORMAL HIGH (ref 12.0–15.0)
Immature Granulocytes: 0 %
Lymphocytes Relative: 30 %
Lymphs Abs: 1.4 10*3/uL (ref 0.7–4.0)
MCH: 32.2 pg (ref 26.0–34.0)
MCHC: 32.5 g/dL (ref 30.0–36.0)
MCV: 99.2 fL (ref 80.0–100.0)
Monocytes Absolute: 0.4 10*3/uL (ref 0.1–1.0)
Monocytes Relative: 8 %
Neutro Abs: 2.6 10*3/uL (ref 1.7–7.7)
Neutrophils Relative %: 56 %
Platelets: 269 10*3/uL (ref 150–400)
RBC: 4.87 MIL/uL (ref 3.87–5.11)
RDW: 12.6 % (ref 11.5–15.5)
WBC: 4.6 10*3/uL (ref 4.0–10.5)
nRBC: 0 % (ref 0.0–0.2)

## 2020-06-12 LAB — RESP PANEL BY RT-PCR (FLU A&B, COVID) ARPGX2
Influenza A by PCR: NEGATIVE
Influenza B by PCR: NEGATIVE
SARS Coronavirus 2 by RT PCR: NEGATIVE

## 2020-06-12 NOTE — ED Provider Notes (Addendum)
[facility] EMERGENCY DEPARTMENT Provider Note   CSN: [id number] Arrival date & time: 06/12/20  0746     History Chief Complaint  Patient presents with  . Nasal Congestion       . Abdominal Pain    Brooke Robertson is a 29 y.o. female with no relevant past medical history presents the ED with a 4-day history of headache, sinus congestion, nonproductive cough, postnasal drip, and crampy epigastric abdominal discomfort.  She also states that she has been feeling weak, particularly with exertion, and endorses diminished appetite.  She states that she just had a menses that concluded last week.  Last BM was normal, 2 days ago.  She denies any chest pain, difficulty breathing, hemoptysis, unilateral extremity swelling or edema, history of blood clots or clotting disorder, vaginal/pelvic pain or discharge, lower abdominal discomfort, nausea or vomiting, inability to eat or drink, or other symptoms.  She lives with her daughter who has also had a mild cough.  She is not immunized for COVID-19 or influenza.  HPI     Past Medical History:  Diagnosis Date  . Anxiety   . Asthma   . Depression   . Gestational diabetes mellitus (GDM)   . Obesity   . Preeclampsia     There are no problems to display for this patient.   Past Surgical History:  Procedure Laterality Date  . CESAREAN SECTION    . TONSILLECTOMY       OB History    Gravida  2   Para  1   Term  1   Preterm      AB      Living  1     SAB      IAB      Ectopic      Multiple      Live Births  1        Obstetric Comments  Failure to dilate        Family History  Problem Relation Age of Onset  . Diabetes Mother   . Depression Father   . Hypertension Father   . Anxiety disorder Father   . Heart disease Father     Social History   Tobacco Use  . Smoking status: Former Smoker    Types: Cigarettes    Quit date: 05/04/2016    Years since quitting: 4.1  . Smokeless tobacco:  Never Used  Vaping Use  . Vaping Use: Never used  Substance Use Topics  . Alcohol use: Not Currently    Comment: none + UPT  . Drug use: Not Currently    Types: Marijuana    Comment: a month ago    Home Medications Prior to Admission medications   Not on File    Allergies    Patient has no known allergies.  Review of Systems   Review of Systems  All other systems reviewed and are negative.   Physical Exam Updated Vital Signs BP 116/82   Pulse 63   Temp 98.7 F (37.1 C) (Oral)   Resp 18   Ht 5' (1.524 m)   Wt 90.7 kg   LMP 02/02/2020   SpO2 100%   BMI 39.06 kg/m   Physical Exam Vitals and nursing note reviewed. Exam conducted with a chaperone present.  Constitutional:      General: She is not in acute distress.    Appearance: Normal appearance. She is not toxic-appearing.  HENT:     Head: Normocephalic and  atraumatic.  Eyes:     General: No scleral icterus.    Conjunctiva/sclera: Conjunctivae normal.  Cardiovascular:     Rate and Rhythm: Normal rate and regular rhythm.     Pulses: Normal pulses.     Heart sounds: Normal heart sounds.  Pulmonary:     Effort: Pulmonary effort is normal. No respiratory distress.     Breath sounds: Normal breath sounds. No wheezing or rales.     Comments: Speaks in full sentences.  No evidence to suggest increased work of breathing.  No tachypnea.  100% on room air.  CTA bilaterally. Abdominal:     General: Abdomen is flat. There is no distension.     Palpations: Abdomen is soft.     Tenderness: There is no abdominal tenderness. There is no guarding.     Comments: Soft, nondistended.  No abdominal tenderness or guarding.  Musculoskeletal:     Right lower leg: No edema.     Left lower leg: No edema.  Skin:    General: Skin is dry.     Capillary Refill: Capillary refill takes less than 2 seconds.  Neurological:     Mental Status: She is alert and oriented to person, place, and time.     GCS: GCS eye subscore is 4. GCS  verbal subscore is 5. GCS motor subscore is 6.  Psychiatric:        Mood and Affect: Mood normal.        Behavior: Behavior normal.        Thought Content: Thought content normal.     ED Results / Procedures / Treatments   Labs (all labs ordered are listed, but only abnormal results are displayed) Labs Reviewed  CBC WITH DIFFERENTIAL/PLATELET - Abnormal; Notable for the following components:      Result Value   Hemoglobin 15.7 (*)    HCT 48.3 (*)    All other components within normal limits  RESP PANEL BY RT-PCR (FLU A&B, COVID) [id number]  COMPREHENSIVE METABOLIC PANEL    EKG None  Radiology No results found.  Procedures Procedures (including critical care time)  Medications Ordered in ED Medications - No data to display  ED Course  I have reviewed the triage vital signs and the nursing notes.  Pertinent labs & imaging results that were available during my care of the patient were reviewed by me and considered in my medical decision making (see chart for details).    MDM Rules/Calculators/A&P                          Her history and physical exam is suggestive of viral infection, will obtain PCR testing for COVID-19 and influenza.  However, given her reported malaise in the context of diminished appetite and recent menses, we will also obtain CBC and BMP to evaluate for anemia or electrolyte derangement.  Lungs are CTA bilaterally and given brevity of illness do not feel as though chest x-ray is warranted.  She denies any chest pain.  CBC and BMP are reviewed and demonstrate no significant derangement.  Symptoms are likely of viral etiology.  Discussed that antibiotics are not indicated for viral infections.  Patient will be discharged with symptomatic treatment.  Patient is tolerating food and liquid without difficulty and I do not believe that laboratory work-up would yield any significant findings.  I emphasized the importance of rest, continued oral hydration, and  antipyretics as needed for fever control.    They  were provided opportunity to ask any additional questions and have none at this time.  Prior to discharge patient is feeling well, agreeable with plan for discharge home.  They have expressed understanding of verbal discharge instructions as well as return precautions and are agreeable to the plan.   Brooke Robertson was evaluated in Emergency Department on 06/12/2020 for the symptoms described in the history of present illness. She was evaluated in the context of the global COVID-19 pandemic, which necessitated consideration that the patient might be at risk for infection with the SARS-CoV-2 virus that causes COVID-19. Institutional protocols and algorithms that pertain to the evaluation of patients at risk for COVID-19 are in a state of rapid change based on information released by regulatory bodies including the CDC and federal and state organizations. These policies and algorithms were followed during the patient's care in the ED.   Final Clinical Impression(s) / ED Diagnoses Final diagnoses:  Viral illness    Rx / DC Orders ED Discharge Orders    None       Lorelee New, PA-C 06/12/20 1328    Lorelee New, PA-C 06/12/20 1329    Cathren Laine, MD 06/13/20 [phone]

## 2020-06-12 NOTE — Discharge Instructions (Addendum)
Continue with over-the-counter medications for symptomatic relief.  Your history of physical exam is suggestive of viral illness.  Your work-up is entirely unremarkable.  While your respiratory panel by PCR was negative, your history concerning for viral infection.  Please maintain isolation precautions.  Read the attachment on infection prevention in the home.  Return to the ED or seek immediate medical attention should you experience any new or worsening symptoms.

## 2020-06-12 NOTE — ED Triage Notes (Signed)
Pt reports abd pain, runny nose and headache that began 2 days ago, denies n/v/d, loss of taste or smell, cough or fevers. No recent known sick contacts.

## 2020-06-12 NOTE — ED Notes (Signed)
Patient verbalizes understanding of discharge instructions. Opportunity for questioning and answers were provided. Armband removed by staff, pt discharged from ED ambulatory to home.  

## 2020-07-18 ENCOUNTER — Ambulatory Visit (INDEPENDENT_AMBULATORY_CARE_PROVIDER_SITE_OTHER): Payer: Medicaid Other | Admitting: Advanced Practice Midwife

## 2020-07-18 ENCOUNTER — Other Ambulatory Visit: Payer: Self-pay

## 2020-07-18 ENCOUNTER — Other Ambulatory Visit (HOSPITAL_COMMUNITY)
Admission: RE | Admit: 2020-07-18 | Discharge: 2020-07-18 | Disposition: A | Discharge status: Home / Self Care | Payer: Medicaid Other | Source: Ambulatory Visit | Attending: Obstetrics and Gynecology | Admitting: Obstetrics and Gynecology

## 2020-07-18 ENCOUNTER — Encounter: Payer: Self-pay | Admitting: Advanced Practice Midwife

## 2020-07-18 VITALS — BP 121/91 | HR 74 | Temp 98.5°F | Ht 59.0 in | Wt 199.2 lb

## 2020-07-18 DIAGNOSIS — Z01419 Encounter for gynecological examination (general) (routine) without abnormal findings: Secondary | ICD-10-CM | POA: Diagnosis not present

## 2020-07-18 DIAGNOSIS — Z113 Encounter for screening for infections with a predominantly sexual mode of transmission: Secondary | ICD-10-CM | POA: Diagnosis not present

## 2020-07-18 DIAGNOSIS — Z124 Encounter for screening for malignant neoplasm of cervix: Secondary | ICD-10-CM | POA: Insufficient documentation

## 2020-07-18 DIAGNOSIS — Z0000 Encounter for general adult medical examination without abnormal findings: Secondary | ICD-10-CM

## 2020-07-18 MED ORDER — FAMOTIDINE 40 MG PO TABS: 40.0000 mg | ORAL_TABLET | Freq: Every day | ORAL | 11 refills | Status: DC

## 2020-07-18 NOTE — Progress Notes (Signed)
GYNECOLOGY ANNUAL PREVENTATIVE CARE ENCOUNTER NOTE  Subjective:   Brooke Robertson is a 30 y.o. G76P1011 female here for a routine annual gynecologic exam.  Denies abnormal vaginal bleeding, discharge, pelvic pain, problems with intercourse or other gynecologic concerns.    Patient reports "stomach pains" for a few weeks near the belly button/RUQ. She reports that the pain comes and goes. She reports the pain is about 6/10 and is worse at work. She denies N/V, constipation or diarrhea. Drinking cold liquids will help.  Gynecologic History Patient's last menstrual period was 07/04/2020 (approximate). Contraception: none Last Pap: approx 2-3 years ago per patient Last mammogram: NA, age . Obstetric History OB History  Gravida Para Term Preterm AB Living  2 1 1   1 1   SAB IAB Ectopic Multiple Live Births  1       1    # Outcome Date GA Lbr Len/2nd Weight Sex Delivery Anes PTL Lv  2 SAB 2021          1 Term 03/30/15 [redacted]w[redacted]d  9 lb 5 oz (4.224 kg) F CS-LTranv  N LIV    Obstetric Comments  Failure to dilate    Past Medical History:  Diagnosis Date  . Anxiety   . Asthma   . Depression   . Gestational diabetes mellitus (GDM)   . Obesity   . Preeclampsia     Past Surgical History:  Procedure Laterality Date  . CESAREAN SECTION    . TONSILLECTOMY      No current outpatient medications on file prior to visit.   No current facility-administered medications on file prior to visit.    No Known Allergies  Social History   Socioeconomic History  . Marital status: Single    Spouse name: Not on file  . Number of children: 1  . Years of education: Not on file  . Highest education level: High school graduate  Occupational History  . Not on file  Tobacco Use  . Smoking status: Former Smoker    Types: Cigarettes    Quit date: 05/04/2016    Years since quitting: 4.2  . Smokeless tobacco: Never Used  Vaping Use  . Vaping Use: Never used  Substance and Sexual Activity  . Alcohol  use: Not Currently    Comment: none + UPT  . Drug use: Not Currently    Types: Marijuana    Comment: a month ago  . Sexual activity: Yes    Birth control/protection: None  Other Topics Concern  . Not on file  Social History Narrative  . Not on file   Social Determinants of Health   Financial Resource Strain: Not on file  Food Insecurity: Not on file  Transportation Needs: Not on file  Physical Activity: Not on file  Stress: Not on file  Social Connections: Not on file  Intimate Partner Violence: Not on file    Family History  Problem Relation Age of Onset  . Diabetes Mother   . Depression Father   . Hypertension Father   . Anxiety disorder Father   . Heart disease Father     The following portions of the patient's history were reviewed and updated as appropriate: allergies, current medications, past family history, past medical history, past social history, past surgical history and problem list.  Review of Systems Pertinent items noted in HPI and remainder of comprehensive ROS otherwise negative.   Objective:  BP (!) 121/91 (BP Location: Left Arm, Patient Position: Sitting, Cuff Size: Large)  Pulse 74   Temp 98.5 F (36.9 C) (Oral)   Ht 4\' 11"  (1.499 m)   Wt 199 lb 3.2 oz (90.4 kg)   LMP 07/04/2020 (Approximate)   Breastfeeding No   BMI 40.23 kg/m  CONSTITUTIONAL: Well-developed, well-nourished female in no acute distress.  HENT:  Normocephalic, atraumatic, External right and left ear normal. Oropharynx is clear and moist EYES: Conjunctivae and EOM are normal. Pupils are equal, round, and reactive to light. No scleral icterus.  NECK: Normal range of motion, supple, no masses.  Normal thyroid.  SKIN: Skin is warm and dry. No rash noted. Not diaphoretic. No erythema. No pallor. NEUROLOGIC: Alert and oriented to person, place, and time. Normal reflexes, muscle tone coordination. No cranial nerve deficit noted. PSYCHIATRIC: Normal mood and affect. Normal behavior.  Normal judgment and thought content. CARDIOVASCULAR: Normal heart rate noted, regular rhythm RESPIRATORY: Clear to auscultation bilaterally. Effort and breath sounds normal, no problems with respiration noted. BREASTS: Symmetric in size. No masses, skin changes, nipple drainage, or lymphadenopathy. ABDOMEN: Soft, normal bowel sounds, no distention noted.  No tenderness, rebound or guarding.  PELVIC: Normal appearing external genitalia; normal appearing vaginal mucosa and cervix.  No abnormal discharge noted.  Pap smear obtained.  Normal uterine size, no other palpable masses, no uterine or adnexal tenderness. MUSCULOSKELETAL: Normal range of motion. No tenderness.  No cyanosis, clubbing, or edema.  2+ distal pulses.   Assessment and Plan:  1. Pap smear for cervical cancer screening - Cytology - PAP( [place]) - HIV antibody (with reflex)  2. Screen for STD (sexually transmitted disease) - Cervicovaginal ancillary only( [place]) - RPR+HBsAg+HCVAb+... - HIV antibody (with reflex)  3. Women's annual routine gynecological examination - Cytology - PAP( [place]) - Cervicovaginal ancillary only( ) - RPR+HBsAg+HCVAb+... - HIV antibody (with reflex)  4. Upper abdominal pain - Pepcid 40mg  daily prescribed  - if no improvement with this considered RUQ ultrasound or GI referral   Will follow up results of pap smear and manage accordingly. Routine preventative health maintenance measures emphasized. Please refer to After Visit Summary for other counseling recommendations.    07/06/2020 DNP, CNM  07/18/20  11:01 AM

## 2020-07-19 ENCOUNTER — Telehealth: Payer: Self-pay | Admitting: General Practice

## 2020-07-19 LAB — COMPREHENSIVE METABOLIC PANEL
ALT: 12 IU/L (ref 0–32)
AST: 15 IU/L (ref 0–40)
Albumin/Globulin Ratio: 1.3 (ref 1.2–2.2)
Albumin: 4.3 g/dL (ref 3.9–5.0)
Alkaline Phosphatase: 45 IU/L (ref 44–121)
BUN/Creatinine Ratio: 12 (ref 9–23)
BUN: 9 mg/dL (ref 6–20)
Bilirubin Total: 0.3 mg/dL (ref 0.0–1.2)
CO2: 22 mmol/L (ref 20–29)
Calcium: 9.5 mg/dL (ref 8.7–10.2)
Chloride: 104 mmol/L (ref 96–106)
Creatinine, Ser: 0.73 mg/dL (ref 0.57–1.00)
GFR calc Af Amer: 129 mL/min/{1.73_m2} (ref >59)
GFR calc non Af Amer: 112 mL/min/{1.73_m2} (ref >59)
Globulin, Total: 3.2 g/dL (ref 1.5–4.5)
Glucose: 76 mg/dL (ref 65–99)
Potassium: 4.1 mmol/L (ref 3.5–5.2)
Sodium: 140 mmol/L (ref 134–144)
Total Protein: 7.5 g/dL (ref 6.0–8.5)

## 2020-07-19 LAB — RPR+HBSAG+HCVAB+...
HIV Screen 4th Generation wRfx: NONREACTIVE
Hep C Virus Ab: <0.1 s/co ratio (ref 0.0–0.9)
Hepatitis B Surface Ag: NEGATIVE
RPR Ser Ql: NONREACTIVE

## 2020-07-19 NOTE — Telephone Encounter (Signed)
Pt has not been covid vaccinated and not interested in getting one at this time.  Provided call back number if she changed her mind.

## 2020-07-20 LAB — CERVICOVAGINAL ANCILLARY ONLY
Bacterial Vaginitis (gardnerella): POSITIVE — AB
Candida Glabrata: NEGATIVE
Candida Vaginitis: NEGATIVE
Chlamydia: NEGATIVE
Comment: NEGATIVE
Comment: NEGATIVE
Comment: NEGATIVE
Comment: NEGATIVE
Comment: NEGATIVE
Comment: NORMAL
Neisseria Gonorrhea: NEGATIVE
Trichomonas: NEGATIVE

## 2020-07-22 MED ORDER — METRONIDAZOLE 500 MG PO TABS: 500.0000 mg | ORAL_TABLET | Freq: Two times a day (BID) | ORAL | 0 refills | Status: DC

## 2020-07-22 NOTE — Addendum Note (Signed)
Addended by: Thressa Sheller D on: 07/22/2020 10:06 PM   Modules accepted: Orders

## 2020-07-23 ENCOUNTER — Telehealth: Payer: Self-pay | Admitting: *Deleted

## 2020-07-23 LAB — CYTOLOGY - PAP: Diagnosis: NEGATIVE

## 2020-07-23 NOTE — Telephone Encounter (Signed)
-----   Message from Armando Reichert, CNM sent at 07/22/2020 10:06 PM EST ----- Patient has BV. RX sent to pharmacy if you can call her.

## 2021-01-15 ENCOUNTER — Other Ambulatory Visit: Payer: Self-pay

## 2021-01-15 ENCOUNTER — Encounter (HOSPITAL_COMMUNITY): Payer: Self-pay | Admitting: Obstetrics and Gynecology

## 2021-01-15 ENCOUNTER — Inpatient Hospital Stay (HOSPITAL_COMMUNITY)
Admission: AD | Admit: 2021-01-15 | Discharge: 2021-01-15 | Disposition: A | Discharge status: Home / Self Care | Payer: Medicaid Other | Attending: Obstetrics and Gynecology | Admitting: Obstetrics and Gynecology

## 2021-01-15 ENCOUNTER — Inpatient Hospital Stay (HOSPITAL_COMMUNITY): Payer: Medicaid Other

## 2021-01-15 DIAGNOSIS — R109 Unspecified abdominal pain: Secondary | ICD-10-CM

## 2021-01-15 DIAGNOSIS — O26891 Other specified pregnancy related conditions, first trimester: Secondary | ICD-10-CM

## 2021-01-15 DIAGNOSIS — O09291 Supervision of pregnancy with other poor reproductive or obstetric history, first trimester: Secondary | ICD-10-CM | POA: Diagnosis not present

## 2021-01-15 DIAGNOSIS — Z3A09 9 weeks gestation of pregnancy: Secondary | ICD-10-CM | POA: Insufficient documentation

## 2021-01-15 DIAGNOSIS — U071 COVID-19: Secondary | ICD-10-CM

## 2021-01-15 DIAGNOSIS — Z3490 Encounter for supervision of normal pregnancy, unspecified, unspecified trimester: Secondary | ICD-10-CM

## 2021-01-15 DIAGNOSIS — Z28310 Unvaccinated for covid-19: Secondary | ICD-10-CM | POA: Diagnosis not present

## 2021-01-15 DIAGNOSIS — O219 Vomiting of pregnancy, unspecified: Secondary | ICD-10-CM

## 2021-01-15 DIAGNOSIS — O98511 Other viral diseases complicating pregnancy, first trimester: Secondary | ICD-10-CM | POA: Insufficient documentation

## 2021-01-15 LAB — URINALYSIS, ROUTINE W REFLEX MICROSCOPIC
Bilirubin Urine: NEGATIVE
Glucose, UA: NEGATIVE mg/dL
Hgb urine dipstick: NEGATIVE
Ketones, ur: 80 mg/dL — AB
Leukocytes,Ua: NEGATIVE
Nitrite: NEGATIVE
Protein, ur: 100 mg/dL — AB
Specific Gravity, Urine: 1.03 (ref 1.005–1.030)
pH: 5 (ref 5.0–8.0)

## 2021-01-15 LAB — HCG, QUANTITATIVE, PREGNANCY: hCG, Beta Chain, Quant, S: 209967 m[IU]/mL — ABNORMAL HIGH (ref <5)

## 2021-01-15 LAB — CBC WITH DIFFERENTIAL/PLATELET
Abs Immature Granulocytes: 0.04 10*3/uL (ref 0.00–0.07)
Basophils Absolute: 0 10*3/uL (ref 0.0–0.1)
Basophils Relative: 1 %
Eosinophils Absolute: 0 10*3/uL (ref 0.0–0.5)
Eosinophils Relative: 0 %
HCT: 41 % (ref 36.0–46.0)
Hemoglobin: 14.4 g/dL (ref 12.0–15.0)
Immature Granulocytes: 1 %
Lymphocytes Relative: 7 %
Lymphs Abs: 0.4 10*3/uL — ABNORMAL LOW (ref 0.7–4.0)
MCH: 31.8 pg (ref 26.0–34.0)
MCHC: 35.1 g/dL (ref 30.0–36.0)
MCV: 90.5 fL (ref 80.0–100.0)
Monocytes Absolute: 0.8 10*3/uL (ref 0.1–1.0)
Monocytes Relative: 16 %
Neutro Abs: 4 10*3/uL (ref 1.7–7.7)
Neutrophils Relative %: 75 %
Platelets: 264 10*3/uL (ref 150–400)
RBC: 4.53 MIL/uL (ref 3.87–5.11)
RDW: 12.1 % (ref 11.5–15.5)
WBC: 5.2 10*3/uL (ref 4.0–10.5)
nRBC: 0 % (ref 0.0–0.2)

## 2021-01-15 LAB — POCT PREGNANCY, URINE: Preg Test, Ur: POSITIVE — AB

## 2021-01-15 LAB — WET PREP, GENITAL
Clue Cells Wet Prep HPF POC: NONE SEEN
Sperm: NONE SEEN
Trich, Wet Prep: NONE SEEN
Yeast Wet Prep HPF POC: NONE SEEN

## 2021-01-15 LAB — RESP PANEL BY RT-PCR (FLU A&B, COVID) ARPGX2
Influenza A by PCR: NEGATIVE
Influenza B by PCR: NEGATIVE
SARS Coronavirus 2 by RT PCR: POSITIVE — AB

## 2021-01-15 MED ORDER — ACETAMINOPHEN 500 MG PO TABS
1000.0000 mg | ORAL_TABLET | Freq: Once | ORAL | Status: AC
  Administered 2021-01-15: 1000 mg via ORAL
  Filled 2021-01-15: qty 2

## 2021-01-15 MED ORDER — PRENATAL VITAMIN 27-0.8 MG PO TABS: 1.0000 | ORAL_TABLET | Freq: Every day | ORAL | 3 refills | Status: DC

## 2021-01-15 MED ORDER — METOCLOPRAMIDE HCL 5 MG/ML IJ SOLN: 10.0000 mg | Freq: Once | INTRAMUSCULAR | Status: DC

## 2021-01-15 MED ORDER — DOXYLAMINE-PYRIDOXINE 10-10 MG PO TBEC: 1.0000 | DELAYED_RELEASE_TABLET | Freq: Every evening | ORAL | 1 refills | Status: DC

## 2021-01-15 MED ORDER — METOCLOPRAMIDE HCL 10 MG PO TABS: 10.0000 mg | ORAL_TABLET | Freq: Three times a day (TID) | ORAL | 0 refills | Status: DC | PRN

## 2021-01-15 MED ORDER — LACTATED RINGERS IV BOLUS: 1000.0000 mL | Freq: Once | INTRAVENOUS | Status: DC

## 2021-01-15 NOTE — MAU Note (Signed)
Patient arrived to MAU complaining of nausea/vomiting,dizziness, and cramping for 3 days. Pt denies vaginal bleeding or leakage of fluid

## 2021-01-15 NOTE — MAU Provider Note (Signed)
History     CSN: [id number]  Arrival date and time: 01/15/21 1448   None     Chief Complaint  Patient presents with   Emesis   Nausea   Abdominal Cramping   Dizziness   30 yo G3P1011 @ unknown gestational age here for vomiting, headache, fever, chills and malaise. She last had her period somewhere between June 18-28 and took a home pregnancy test that was positive 3 weeks ago. She went to pregnancy care 2 days ago where they did blood work but no ultrasound and told her she was 8 wks. She has a history of ectopic pregnancy with her last pregnancy in 2021 that was medically managed. She denies cramping, vaginal bleeding. She has had some nausea and vomiting during her pregnancy 2-3 days ago she began having worsening nausea, vomiting, fevers, chills, headaches, body aches and general malaise. She states she has not been able to keep much of anything down. She denies any sick contacts and has not had her COVID vaccine. She also endorses moderate diarrhea, 3 times today.    OB History     Gravida  3   Para  1   Term  1   Preterm      AB  1   Living  1      SAB  1   IAB      Ectopic      Multiple      Live Births  1        Obstetric Comments  Failure to dilate         Past Medical History:  Diagnosis Date   Anxiety    Asthma    Depression    Gestational diabetes mellitus (GDM)    Obesity    Preeclampsia     Past Surgical History:  Procedure Laterality Date   CESAREAN SECTION     TONSILLECTOMY      Family History  Problem Relation Age of Onset   Diabetes Mother    Depression Father    Hypertension Father    Anxiety disorder Father    Heart disease Father     Social History   Tobacco Use   Smoking status: Former    Types: Cigarettes    Quit date: 05/04/2016    Years since quitting: 4.7   Smokeless tobacco: Never  Vaping Use   Vaping Use: Never used  Substance Use Topics   Alcohol use: Not Currently    Comment: none + UPT   Drug  use: Not Currently    Types: Marijuana    Comment: a month ago    Allergies: No Known Allergies  Medications Prior to Admission  Medication Sig Dispense Refill Last Dose   famotidine (PEPCID) 40 MG tablet Take 1 tablet (40 mg total) by mouth daily. 30 tablet 11    metroNIDAZOLE (FLAGYL) 500 MG tablet Take 1 tablet (500 mg total) by mouth 2 (two) times daily. 14 tablet 0     Review of Systems  Constitutional:  Positive for chills, fatigue and fever.  HENT:  Positive for sinus pressure.   Respiratory:  Negative for shortness of breath.   Cardiovascular:  Negative for chest pain.  Gastrointestinal:  Positive for abdominal pain and diarrhea.  Genitourinary:  Negative for dysuria.  Musculoskeletal:  Positive for back pain and myalgias.  Neurological:  Positive for headaches.  Physical Exam   Blood pressure 128/84, pulse (!) 103, temperature (!) 102.1 F (38.9 C), temperature source Oral, resp.  rate 18, height 5' (1.524 m), weight 80.3 kg, last menstrual period 12/11/2020, SpO2 99 %.  Physical Exam Constitutional:      General: She is not in acute distress.    Appearance: Normal appearance. She is not ill-appearing.  HENT:     Head: Normocephalic and atraumatic.  Cardiovascular:     Rate and Rhythm: Normal rate and regular rhythm.     Pulses: Normal pulses.     Heart sounds: Normal heart sounds.  Pulmonary:     Effort: Pulmonary effort is normal. No respiratory distress.     Breath sounds: Normal breath sounds.  Abdominal:     General: Abdomen is flat.     Palpations: Abdomen is soft.  Musculoskeletal:     Right lower leg: No edema.     Left lower leg: No edema.  Skin:    General: Skin is warm and dry.     Capillary Refill: Capillary refill takes less than 2 seconds.  Neurological:     General: No focal deficit present.     Mental Status: She is alert and oriented to person, place, and time.    MAU Course  Procedures  MDM Vitals and nursing note  reviewed Transvaginal ultrasound collected, showed IUP @ [redacted]w[redacted]d COVID test positive  Assessment and Plan  Intrauterine Pregnancy Ultrasound and Hcg confirmation for pregnancy at [redacted]w[redacted]d. Patient will need to establish for prenatal care -send prenatal vitamins  COVID-19 Mild symptoms, COVID positive. Got tylenol for fever and ordered fluids and reglan. Recommend supportive care Patient would like something for nausea for discharge, will send metoclopramide 10mg  and diclegis   Abdominal cramping Likely 2/2 COVID infection but got G/C and wet prep collected prior to discharge  01/15/2021, 4:34 PM

## 2021-01-16 LAB — GC/CHLAMYDIA PROBE AMP (~~LOC~~) NOT AT ARMC
Chlamydia: NEGATIVE
Comment: NEGATIVE
Comment: NORMAL
Neisseria Gonorrhea: NEGATIVE

## 2021-02-01 ENCOUNTER — Ambulatory Visit: Payer: Medicaid Other | Admitting: Certified Nurse Midwife

## 2021-02-19 ENCOUNTER — Ambulatory Visit (INDEPENDENT_AMBULATORY_CARE_PROVIDER_SITE_OTHER): Payer: Medicaid Other | Admitting: *Deleted

## 2021-02-19 ENCOUNTER — Encounter: Payer: Medicaid Other | Admitting: Women's Health

## 2021-02-19 ENCOUNTER — Other Ambulatory Visit: Payer: Self-pay

## 2021-02-19 VITALS — BP 115/75 | HR 87 | Temp 98.2°F | Wt 186.4 lb

## 2021-02-19 DIAGNOSIS — Z3480 Encounter for supervision of other normal pregnancy, unspecified trimester: Secondary | ICD-10-CM

## 2021-02-19 DIAGNOSIS — O0990 Supervision of high risk pregnancy, unspecified, unspecified trimester: Secondary | ICD-10-CM | POA: Insufficient documentation

## 2021-02-19 LAB — HEPATITIS C ANTIBODY: HCV Ab: NEGATIVE

## 2021-02-19 MED ORDER — GOJJI WEIGHT SCALE MISC: 1.0000 | Freq: Every day | 0 refills | Status: DC | PRN

## 2021-02-19 MED ORDER — BLOOD PRESSURE MONITOR AUTOMAT DEVI: 1.0000 | Freq: Every day | 0 refills | Status: DC

## 2021-02-19 NOTE — Progress Notes (Signed)
   Location: [facility] Patient: clinic Provider: clinic  PRENATAL INTAKE SUMMARY  Ms. Brooke Robertson presents today New OB Nurse Interview.  OB History     Gravida  3   Para  1   Term  1   Preterm      AB  1   Living  1      SAB  1   IAB      Ectopic      Multiple      Live Births  1        Obstetric Comments  Failure to dilate        I have reviewed the patient's medical, obstetrical, social, and family histories, medications, and available lab results.  SUBJECTIVE She has no unusual complaints  OBJECTIVE Initial Nurse interview for history/labs (New OB)  EDD: 08/18/2021 by early ultrasound GA: [redacted]w[redacted]d [id number] FHT: 144  GENERAL APPEARANCE: alert, well appearing, in no apparent distress, oriented to person, place and time   ASSESSMENT Normal pregnancy  PLAN Prenatal care:  [facility] OB Pnl/HIV/Hep C OB Urine Culture GC/CT at next visit with Edd Arbour, CNM 02/20/21 HgbEval/SMA/CF ([facility]) Panorama A1C If CHTN - P/C Ratio and CMP (hx of PIH) Rx [facility] for BP monitor and weight scale Continue PNV  Sign up for Babyscripts  Follow Up Instructions:   I discussed the assessment and treatment plan with the patient. The patient was provided an opportunity to ask questions and all were answered. The patient agreed with the plan and demonstrated an understanding of the instructions.   The patient was advised to call back or seek an in-person evaluation if the symptoms worsen or if the condition fails to improve as anticipated.  I provided 40 minutes of  face-to-face time during this encounter.  Clovis Pu, RN

## 2021-02-20 ENCOUNTER — Other Ambulatory Visit (HOSPITAL_COMMUNITY)
Admission: RE | Admit: 2021-02-20 | Discharge: 2021-02-20 | Disposition: A | Discharge status: Home / Self Care | Payer: Medicaid Other | Source: Ambulatory Visit | Attending: Certified Nurse Midwife | Admitting: Certified Nurse Midwife

## 2021-02-20 ENCOUNTER — Ambulatory Visit (INDEPENDENT_AMBULATORY_CARE_PROVIDER_SITE_OTHER): Payer: Medicaid Other | Admitting: Certified Nurse Midwife

## 2021-02-20 VITALS — BP 106/61 | HR 79 | Temp 98.3°F | Wt 188.0 lb

## 2021-02-20 DIAGNOSIS — Z3480 Encounter for supervision of other normal pregnancy, unspecified trimester: Secondary | ICD-10-CM | POA: Insufficient documentation

## 2021-02-20 DIAGNOSIS — F32A Depression, unspecified: Secondary | ICD-10-CM

## 2021-02-20 DIAGNOSIS — Z3A14 14 weeks gestation of pregnancy: Secondary | ICD-10-CM

## 2021-02-20 DIAGNOSIS — F419 Anxiety disorder, unspecified: Secondary | ICD-10-CM

## 2021-02-20 DIAGNOSIS — O34219 Maternal care for unspecified type scar from previous cesarean delivery: Secondary | ICD-10-CM

## 2021-02-20 DIAGNOSIS — O0932 Supervision of pregnancy with insufficient antenatal care, second trimester: Secondary | ICD-10-CM

## 2021-02-20 DIAGNOSIS — O0992 Supervision of high risk pregnancy, unspecified, second trimester: Secondary | ICD-10-CM | POA: Diagnosis not present

## 2021-02-20 DIAGNOSIS — N760 Acute vaginitis: Secondary | ICD-10-CM

## 2021-02-20 DIAGNOSIS — B9689 Other specified bacterial agents as the cause of diseases classified elsewhere: Secondary | ICD-10-CM

## 2021-02-20 DIAGNOSIS — Z3492 Encounter for supervision of normal pregnancy, unspecified, second trimester: Secondary | ICD-10-CM | POA: Diagnosis not present

## 2021-02-20 LAB — COMPREHENSIVE METABOLIC PANEL
ALT: 13 IU/L (ref 0–32)
AST: 16 IU/L (ref 0–40)
Albumin/Globulin Ratio: 1.2 (ref 1.2–2.2)
Albumin: 3.5 g/dL — ABNORMAL LOW (ref 3.9–5.0)
Alkaline Phosphatase: 41 IU/L — ABNORMAL LOW (ref 44–121)
BUN/Creatinine Ratio: 13 (ref 9–23)
BUN: 7 mg/dL (ref 6–20)
Bilirubin Total: 0.2 mg/dL (ref 0.0–1.2)
CO2: 19 mmol/L — ABNORMAL LOW (ref 20–29)
Calcium: 9 mg/dL (ref 8.7–10.2)
Chloride: 104 mmol/L (ref 96–106)
Creatinine, Ser: 0.54 mg/dL — ABNORMAL LOW (ref 0.57–1.00)
Globulin, Total: 2.9 g/dL (ref 1.5–4.5)
Glucose: 83 mg/dL (ref 65–99)
Potassium: 4 mmol/L (ref 3.5–5.2)
Sodium: 135 mmol/L (ref 134–144)
Total Protein: 6.4 g/dL (ref 6.0–8.5)
eGFR: 128 mL/min/{1.73_m2} (ref >59)

## 2021-02-20 LAB — PROTEIN / CREATININE RATIO, URINE
Creatinine, Urine: 100.5 mg/dL
Protein, Ur: 34.2 mg/dL
Protein/Creat Ratio: 340 mg/g creat — ABNORMAL HIGH (ref 0–200)

## 2021-02-20 NOTE — Progress Notes (Signed)
History:   Brooke Robertson is a 30 y.o. G3P1011 at [redacted]w[redacted]d by LMP being seen today for her first obstetrical visit.  Her obstetrical history is significant for pre-eclampsia and gestational diabetes. Last baby was delivered via low transverse CS at 37wks due to preeclampsia. Attempted IOL but proceeded with CS due to LGA/failure to dilate . Patient does intend to breast feed. She desires a BTL for birth control, will sign consent at 28wk visit. Pregnancy history fully reviewed.   Patient reports  continuing to be easily fatigued after Covid infection in early August. Otherwise feeling better, has had much less nausea and only occasional vomiting if she eats meat or acidic foods .    HISTORY: OB History  Gravida Para Term Preterm AB Living  3 1 1  0 1 1  SAB IAB Ectopic Multiple Live Births  1 0 0 0 1    # Outcome Date GA Lbr Len/2nd Weight Sex Delivery Anes PTL Lv  3 Current           2 SAB 2021          1 Term 03/30/15 [redacted]w[redacted]d  9 lb 5 oz (4.224 kg) F CS-LTranv  N LIV    Obstetric Comments  Failure to dilate    Last pap smear was done 07/2020 and was normal  Past Medical History:  Diagnosis Date   Anxiety    Asthma    Depression    Gestational diabetes mellitus (GDM)    Obesity    Preeclampsia    Past Surgical History:  Procedure Laterality Date   CESAREAN SECTION     TONSILLECTOMY     Family History  Problem Relation Age of Onset   Diabetes Mother    Depression Father    Hypertension Father    Anxiety disorder Father    Heart disease Father    Social History   Tobacco Use   Smoking status: Former    Types: Cigarettes    Quit date: 05/04/2016    Years since quitting: 4.8    Passive exposure: Never   Smokeless tobacco: Never  Vaping Use   Vaping Use: Never used  Substance Use Topics   Alcohol use: Not Currently    Comment: none + UPT   Drug use: Not Currently    Types: Marijuana    Comment: last use a couple of weeks ago   No Known Allergies Current Outpatient  Medications on File Prior to Visit  Medication Sig Dispense Refill   Blood Pressure Monitoring (BLOOD PRESSURE MONITOR AUTOMAT) DEVI 1 Device by Does not apply route daily. Automatic blood pressure cuff regular size. To monitor blood pressure regularly at home. ICD-10 code:Z34.90 1 each 0   Doxylamine-Pyridoxine 10-10 MG TBEC Take 1 tablet by mouth at bedtime. 60 tablet 1   Misc. Devices (GOJJI WEIGHT SCALE) MISC 1 Device by Does not apply route daily as needed. To weight self daily as needed at home. ICD-10 code: Z34.90 1 each 0   Prenatal Vit-Fe Fumarate-FA (PRENATAL VITAMIN) 27-0.8 MG TABS Take 1 tablet by mouth daily. 30 tablet 3   metoCLOPramide (REGLAN) 10 MG tablet Take 1 tablet (10 mg total) by mouth every 8 (eight) hours as needed for nausea or vomiting. 30 tablet 0   No current facility-administered medications on file prior to visit.   Review of Systems Pertinent items noted in HPI and remainder of comprehensive ROS otherwise negative. Physical Exam:   Vitals:   02/20/21 0911  BP: 106/61  Pulse: 79  Temp: 98.3 F (36.8 C)  Weight: 188 lb (85.3 kg)   Fetal Heart Rate (bpm): 150  Constitutional: Well-developed, well-nourished pregnant female in no acute distress.  HEENT: PERRLA Skin: normal color and turgor, no rash Cardiovascular: normal rate & rhythm, no murmur Respiratory: normal effort, lung sounds clear throughout GI: Abd soft, non-tender, pos BS x 4, gravid appropriate for gestational age - CS scar is higher than usual, about 2-3cm below umbilicus. Pt states the MD told her they were doing it higher to avoid all the fluid (dependent edema). MS: Extremities nontender, no edema, normal ROM Neurologic: Alert and oriented x 4.  GU: no CVA tenderness Pelvic: Exam deferred  Assessment & Plan:  1. Supervision of high-risk pregnancy, second trimester - Doing well, not feeling fetal movement yet - Due to multiple risk factors and OB history, advised pt we may need to  switch her to a higher risk office for availability of MD consults/visits. Pt expressed understanding. - Cervicovaginal ancillary only - [place] MFM OB DETAIL +14 WK; Future  2. [redacted] weeks gestation of pregnancy - Routine OB care - Will offer/get AFP at next visit  3. History of cesarean delivery, antepartum - Desires TOLAC if she goes into labor on her own, prefers a CS if she has to be induced for PEC again.   4. Initial obstetric visit in second trimester - Initial labs reviewed (A1C normal) - Continue prenatal vitamins. - Problem list reviewed and updated. - Genetic Screening discussed, First trimester screen, Quad screen, and NIPS: ordered. - Ultrasound discussed; fetal anatomic survey: ordered. - Anticipatory guidance about prenatal visits given including labs, ultrasounds, and testing. - Discussed usage of Babyscripts and virtual visits as additional source of managing and completing prenatal visits in midst of coronavirus and pandemic.   - Encouraged to complete MyChart Registration for her ability to review results, send requests, and have questions addressed.  The nature of [place] [facility] with multiple MDs and Advanced Practice Providers was explained to patient; also emphasized that residents, students are part of our team. Routine obstetric precautions reviewed. Encouraged to seek out care at office or emergency room [facility] preferred) for urgent and/or emergent concerns.  5. Anxiety and depression - History of both, not on meds and screenings were elevated - Referral made to Integrated Behavioral Health  Return in about 4 weeks (around 03/20/2021) for IN-PERSON, HOB.  - Spoke to Vonzella Nipple, NP about patient's risk factors - she advised pt should be transferred to [facility]. Message sent to [facility] staff.  Edd Arbour, MSN, CNM, IBCLC Certified Nurse Midwife, [facility]

## 2021-02-21 LAB — CERVICOVAGINAL ANCILLARY ONLY
Bacterial Vaginitis (gardnerella): POSITIVE — AB
Candida Glabrata: NEGATIVE
Candida Vaginitis: NEGATIVE
Chlamydia: NEGATIVE
Comment: NEGATIVE
Comment: NEGATIVE
Comment: NEGATIVE
Comment: NEGATIVE
Comment: NEGATIVE
Comment: NORMAL
Neisseria Gonorrhea: NEGATIVE
Trichomonas: NEGATIVE

## 2021-02-21 LAB — OBSTETRIC PANEL, INCLUDING HIV
Basophils Absolute: 0.1 10*3/uL (ref 0.0–0.2)
Basos: 1 %
EOS (ABSOLUTE): 0.1 10*3/uL (ref 0.0–0.4)
Eos: 2 %
HIV Screen 4th Generation wRfx: NONREACTIVE
Hematocrit: 38 % (ref 34.0–46.6)
Hemoglobin: 13.1 g/dL (ref 11.1–15.9)
Hepatitis B Surface Ag: NEGATIVE
Immature Grans (Abs): 0.1 10*3/uL (ref 0.0–0.1)
Immature Granulocytes: 1 %
Lymphocytes Absolute: 1.6 10*3/uL (ref 0.7–3.1)
Lymphs: 24 %
MCH: 32.2 pg (ref 26.6–33.0)
MCHC: 34.5 g/dL (ref 31.5–35.7)
MCV: 93 fL (ref 79–97)
Monocytes Absolute: 0.5 10*3/uL (ref 0.1–0.9)
Monocytes: 7 %
Neutrophils Absolute: 4.5 10*3/uL (ref 1.4–7.0)
Neutrophils: 65 %
Platelets: 310 10*3/uL (ref 150–450)
RBC: 4.07 x10E6/uL (ref 3.77–5.28)
RDW: 12.6 % (ref 11.7–15.4)
RPR Ser Ql: NONREACTIVE
Rh Factor: POSITIVE
Rubella Antibodies, IGG: 2.36 index (ref >0.99)
WBC: 6.9 10*3/uL (ref 3.4–10.8)

## 2021-02-21 LAB — CULTURE, OB URINE

## 2021-02-21 LAB — AB SCR+ANTIBODY ID: Antibody Screen: POSITIVE — AB

## 2021-02-21 LAB — HEMOGLOBIN A1C
Est. average glucose Bld gHb Est-mCnc: 103 mg/dL
Hgb A1c MFr Bld: 5.2 % (ref 4.8–5.6)

## 2021-02-21 LAB — URINE CULTURE, OB REFLEX

## 2021-02-23 MED ORDER — METRONIDAZOLE 0.75 % VA GEL: 1.0000 | Freq: Every day | VAGINAL | 1 refills | Status: DC

## 2021-02-23 NOTE — Addendum Note (Signed)
Addended by: Edd Arbour on: 02/23/2021 03:27 PM   Modules accepted: Orders

## 2021-02-25 ENCOUNTER — Other Ambulatory Visit: Payer: Self-pay | Admitting: Certified Nurse Midwife

## 2021-02-25 DIAGNOSIS — O361930 Maternal care for other isoimmunization, third trimester, not applicable or unspecified: Secondary | ICD-10-CM | POA: Insufficient documentation

## 2021-02-25 DIAGNOSIS — O361920 Maternal care for other isoimmunization, second trimester, not applicable or unspecified: Secondary | ICD-10-CM | POA: Insufficient documentation

## 2021-02-25 DIAGNOSIS — O1212 Gestational proteinuria, second trimester: Secondary | ICD-10-CM | POA: Insufficient documentation

## 2021-02-25 NOTE — Progress Notes (Signed)
This patient is being transferred to [facility] for care, I am adding a future P:Cr for comparison and she will need a monthly antibody screen/titers.

## 2021-03-20 ENCOUNTER — Encounter: Payer: Medicaid Other | Admitting: Certified Nurse Midwife

## 2021-03-22 ENCOUNTER — Encounter: Payer: Medicaid Other | Admitting: Certified Nurse Midwife

## 2021-03-25 ENCOUNTER — Encounter: Payer: Self-pay | Admitting: *Deleted

## 2021-03-25 ENCOUNTER — Ambulatory Visit: Payer: Medicaid Other | Admitting: *Deleted

## 2021-03-25 ENCOUNTER — Other Ambulatory Visit: Payer: Self-pay | Admitting: *Deleted

## 2021-03-25 ENCOUNTER — Ambulatory Visit: Payer: Medicaid Other | Attending: Obstetrics and Gynecology

## 2021-03-25 ENCOUNTER — Other Ambulatory Visit: Payer: Self-pay

## 2021-03-25 VITALS — BP 114/55 | HR 68

## 2021-03-25 DIAGNOSIS — O34219 Maternal care for unspecified type scar from previous cesarean delivery: Secondary | ICD-10-CM

## 2021-03-25 DIAGNOSIS — Z3A14 14 weeks gestation of pregnancy: Secondary | ICD-10-CM | POA: Insufficient documentation

## 2021-03-25 DIAGNOSIS — O0992 Supervision of high risk pregnancy, unspecified, second trimester: Secondary | ICD-10-CM | POA: Diagnosis not present

## 2021-03-25 DIAGNOSIS — O0990 Supervision of high risk pregnancy, unspecified, unspecified trimester: Secondary | ICD-10-CM | POA: Insufficient documentation

## 2021-03-25 DIAGNOSIS — O09299 Supervision of pregnancy with other poor reproductive or obstetric history, unspecified trimester: Secondary | ICD-10-CM

## 2021-03-25 DIAGNOSIS — R638 Other symptoms and signs concerning food and fluid intake: Secondary | ICD-10-CM

## 2021-03-25 DIAGNOSIS — Z8632 Personal history of gestational diabetes: Secondary | ICD-10-CM

## 2021-04-09 ENCOUNTER — Ambulatory Visit (INDEPENDENT_AMBULATORY_CARE_PROVIDER_SITE_OTHER): Payer: Medicaid Other | Admitting: Family Medicine

## 2021-04-09 ENCOUNTER — Other Ambulatory Visit: Payer: Self-pay

## 2021-04-09 ENCOUNTER — Encounter: Payer: Self-pay | Admitting: Family Medicine

## 2021-04-09 VITALS — BP 113/74 | HR 74 | Wt 204.0 lb

## 2021-04-09 DIAGNOSIS — O361920 Maternal care for other isoimmunization, second trimester, not applicable or unspecified: Secondary | ICD-10-CM

## 2021-04-09 DIAGNOSIS — O1212 Gestational proteinuria, second trimester: Secondary | ICD-10-CM

## 2021-04-09 DIAGNOSIS — Z8632 Personal history of gestational diabetes: Secondary | ICD-10-CM

## 2021-04-09 DIAGNOSIS — Z8759 Personal history of other complications of pregnancy, childbirth and the puerperium: Secondary | ICD-10-CM

## 2021-04-09 DIAGNOSIS — O0990 Supervision of high risk pregnancy, unspecified, unspecified trimester: Secondary | ICD-10-CM

## 2021-04-09 DIAGNOSIS — Z98891 History of uterine scar from previous surgery: Secondary | ICD-10-CM | POA: Insufficient documentation

## 2021-04-09 HISTORY — DX: Personal history of other complications of pregnancy, childbirth and the puerperium: Z87.59

## 2021-04-09 HISTORY — DX: Personal history of gestational diabetes: Z86.32

## 2021-04-09 MED ORDER — ASPIRIN EC 81 MG PO TBEC
81.0000 mg | DELAYED_RELEASE_TABLET | Freq: Every day | ORAL | 11 refills | Status: DC
Start: 2021-04-09 — End: 2021-08-13

## 2021-04-09 NOTE — Patient Instructions (Signed)

## 2021-04-09 NOTE — Progress Notes (Signed)
   Subjective:  SHADEE RATHOD is a 30 y.o. G3P1011 at [redacted]w[redacted]d being seen today for ongoing prenatal care.  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  Patient reports no complaints.  Contractions: Not present. Vag. Bleeding: None.  Movement: Present. Denies leaking of fluid.   The following portions of the patient's history were reviewed and updated as appropriate: allergies, current medications, past family history, past medical history, past social history, past surgical history and problem list. Problem list updated.  Objective:   Vitals:   04/09/21 0903  BP: 113/74  Pulse: 74  Weight: 204 lb (92.5 kg)    Fetal Status: Fetal Heart Rate (bpm): 145   Movement: Present     General:  Alert, oriented and cooperative. Patient is in no acute distress.  Skin: Skin is warm and dry. No rash noted.   Cardiovascular: Normal heart rate noted  Respiratory: Normal respiratory effort, no problems with respiration noted  Abdomen: Soft, gravid, appropriate for gestational age. Pain/Pressure: Present     Pelvic: Vag. Bleeding: None     Cervical exam deferred        Extremities: Normal range of motion.  Edema: None  Mental Status: Normal mood and affect. Normal behavior. Normal judgment and thought content.   Urinalysis:      Assessment and Plan:  Pregnancy: G3P1011 at 103w2d  1. Supervision of high risk pregnancy, antepartum BP and FHR normal [facility] due to high risk pregnancy  2. Proteinuria affecting pregnancy in second trimester P:C 0.34 on new OB labs, will recheck today  3. 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 History of gestational diabetes Normal A1c on intake labs  5. History of pre-eclampsia Normal BP Not yet taking baby ASA, rx today  6. History of cesarean delivery Reports her blood pressure was elevated at 37 weeks as reason for early term delivery No labor, she was scheduled for a c section due to concern about fetal macrosomia Likely indication was fetal macrosomia in setting of GDM Interested in TOLAC Discussed we will need to assess if she is a good candidate a little farther down the line, she is ok with this plan   Preterm labor symptoms and general obstetric precautions including but not limited to vaginal bleeding, contractions, leaking of fluid and fetal movement were reviewed in detail with the patient. Please refer to After Visit Summary for other counseling recommendations.  Return in 4 weeks (on 05/07/2021) for [facility], ob visit.   Venora Maples, MD

## 2021-04-10 LAB — PROTEIN / CREATININE RATIO, URINE
Creatinine, Urine: 67.2 mg/dL
Protein, Ur: 6.1 mg/dL
Protein/Creat Ratio: 91 mg/g creat (ref 0–200)

## 2021-05-08 ENCOUNTER — Encounter: Payer: Medicaid Other | Admitting: Obstetrics & Gynecology

## 2021-05-20 ENCOUNTER — Other Ambulatory Visit: Payer: Self-pay | Admitting: *Deleted

## 2021-05-20 ENCOUNTER — Ambulatory Visit: Payer: Medicaid Other | Admitting: *Deleted

## 2021-05-20 ENCOUNTER — Ambulatory Visit: Payer: Medicaid Other | Attending: Obstetrics and Gynecology

## 2021-05-20 ENCOUNTER — Other Ambulatory Visit: Payer: Self-pay

## 2021-05-20 VITALS — BP 124/71 | HR 81

## 2021-05-20 DIAGNOSIS — O34219 Maternal care for unspecified type scar from previous cesarean delivery: Secondary | ICD-10-CM

## 2021-05-20 DIAGNOSIS — O09292 Supervision of pregnancy with other poor reproductive or obstetric history, second trimester: Secondary | ICD-10-CM | POA: Diagnosis not present

## 2021-05-20 DIAGNOSIS — Z3A27 27 weeks gestation of pregnancy: Secondary | ICD-10-CM

## 2021-05-20 DIAGNOSIS — O09299 Supervision of pregnancy with other poor reproductive or obstetric history, unspecified trimester: Secondary | ICD-10-CM | POA: Insufficient documentation

## 2021-05-20 DIAGNOSIS — O99212 Obesity complicating pregnancy, second trimester: Secondary | ICD-10-CM

## 2021-05-20 DIAGNOSIS — O0990 Supervision of high risk pregnancy, unspecified, unspecified trimester: Secondary | ICD-10-CM

## 2021-05-20 DIAGNOSIS — R638 Other symptoms and signs concerning food and fluid intake: Secondary | ICD-10-CM | POA: Diagnosis not present

## 2021-05-20 DIAGNOSIS — Z8632 Personal history of gestational diabetes: Secondary | ICD-10-CM | POA: Insufficient documentation

## 2021-05-25 ENCOUNTER — Encounter (HOSPITAL_COMMUNITY): Payer: Self-pay | Admitting: Family Medicine

## 2021-05-25 ENCOUNTER — Other Ambulatory Visit: Payer: Self-pay

## 2021-05-25 ENCOUNTER — Inpatient Hospital Stay (HOSPITAL_COMMUNITY)
Admission: AD | Admit: 2021-05-25 | Discharge: 2021-05-25 | Disposition: A | Discharge status: Home / Self Care | Payer: Medicaid Other | Attending: Family Medicine | Admitting: Family Medicine

## 2021-05-25 DIAGNOSIS — O4692 Antepartum hemorrhage, unspecified, second trimester: Secondary | ICD-10-CM | POA: Diagnosis not present

## 2021-05-25 DIAGNOSIS — B3731 Acute candidiasis of vulva and vagina: Secondary | ICD-10-CM

## 2021-05-25 DIAGNOSIS — O23593 Infection of other part of genital tract in pregnancy, third trimester: Secondary | ICD-10-CM | POA: Diagnosis present

## 2021-05-25 DIAGNOSIS — N898 Other specified noninflammatory disorders of vagina: Secondary | ICD-10-CM | POA: Insufficient documentation

## 2021-05-25 DIAGNOSIS — A5901 Trichomonal vulvovaginitis: Secondary | ICD-10-CM | POA: Diagnosis present

## 2021-05-25 DIAGNOSIS — J45909 Unspecified asthma, uncomplicated: Secondary | ICD-10-CM | POA: Insufficient documentation

## 2021-05-25 DIAGNOSIS — Z3A27 27 weeks gestation of pregnancy: Secondary | ICD-10-CM | POA: Insufficient documentation

## 2021-05-25 DIAGNOSIS — O99212 Obesity complicating pregnancy, second trimester: Secondary | ICD-10-CM | POA: Diagnosis not present

## 2021-05-25 DIAGNOSIS — O26892 Other specified pregnancy related conditions, second trimester: Secondary | ICD-10-CM | POA: Diagnosis not present

## 2021-05-25 DIAGNOSIS — A599 Trichomoniasis, unspecified: Secondary | ICD-10-CM | POA: Diagnosis not present

## 2021-05-25 DIAGNOSIS — O99512 Diseases of the respiratory system complicating pregnancy, second trimester: Secondary | ICD-10-CM | POA: Diagnosis not present

## 2021-05-25 DIAGNOSIS — O98819 Other maternal infectious and parasitic diseases complicating pregnancy, unspecified trimester: Secondary | ICD-10-CM

## 2021-05-25 DIAGNOSIS — O0990 Supervision of high risk pregnancy, unspecified, unspecified trimester: Secondary | ICD-10-CM

## 2021-05-25 DIAGNOSIS — Z87891 Personal history of nicotine dependence: Secondary | ICD-10-CM | POA: Insufficient documentation

## 2021-05-25 LAB — URINALYSIS, ROUTINE W REFLEX MICROSCOPIC
Bilirubin Urine: NEGATIVE
Glucose, UA: NEGATIVE mg/dL
Ketones, ur: NEGATIVE mg/dL
Nitrite: NEGATIVE
Protein, ur: NEGATIVE mg/dL
Specific Gravity, Urine: 1.026 (ref 1.005–1.030)
WBC, UA: >50 WBC/hpf — ABNORMAL HIGH (ref 0–5)
pH: 6 (ref 5.0–8.0)

## 2021-05-25 LAB — WET PREP, GENITAL
Sperm: NONE SEEN
WBC, Wet Prep HPF POC: >=10 — AB (ref <10)

## 2021-05-25 MED ORDER — METRONIDAZOLE 500 MG PO TABS: 500.0000 mg | ORAL_TABLET | Freq: Once | ORAL | Status: DC

## 2021-05-25 MED ORDER — TERCONAZOLE 0.4 % VA CREA: 1.0000 | TOPICAL_CREAM | Freq: Every day | VAGINAL | 0 refills | Status: DC

## 2021-05-25 MED ORDER — METRONIDAZOLE 500 MG PO TABS: 500.0000 mg | ORAL_TABLET | Freq: Two times a day (BID) | ORAL | 0 refills | Status: DC

## 2021-05-25 NOTE — MAU Provider Note (Signed)
History     CSN: [id number]  Arrival date and time: 05/25/21 1245   Event Date/Time   First Provider Initiated Contact with Patient 05/25/21 0051      Chief Complaint  Patient presents with   Vaginal Bleeding   Brooke Robertson is a 30 y.o. G3P1011 at [redacted]w[redacted]d who receives care at [facility].  She presents today, via EMS, for Vaginal Spotting.  She states she had some "pinkish" discharge with wiping around 1130 pm.  She reports she has been experiencing increased discharge and endorses itching, but denies odor.  She states the discharge has been increasingly worse "for a couple days."  She states she used an "itching cream" with some relief.  Patient endorses fetal movement and denies cramping or contractions.  She also denies issues with urination and recent sexual activity   OB History     Gravida  3   Para  1   Term  1   Preterm      AB  1   Living  1      SAB  1   IAB      Ectopic      Multiple      Live Births  1        Obstetric Comments  Failure to dilate         Past Medical History:  Diagnosis Date   Anxiety    Asthma    Depression    Gestational diabetes mellitus (GDM)    Obesity    Preeclampsia     Past Surgical History:  Procedure Laterality Date   CESAREAN SECTION     TONSILLECTOMY      Family History  Problem Relation Age of Onset   Diabetes Mother    Depression Father    Hypertension Father    Anxiety disorder Father    Heart disease Father     Social History   Tobacco Use   Smoking status: Former    Types: Cigarettes    Quit date: 05/04/2016    Years since quitting: 5.0    Passive exposure: Never   Smokeless tobacco: Never  Vaping Use   Vaping Use: Never used  Substance Use Topics   Alcohol use: Not Currently    Comment: none + UPT   Drug use: Not Currently    Types: Marijuana    Comment: last use a couple of weeks ago    Allergies: No Known Allergies  Medications Prior to Admission  Medication Sig Dispense  Refill Last Dose   aspirin EC 81 MG tablet Take 1 tablet (81 mg total) by mouth daily. Swallow whole. 30 tablet 11 05/24/2021   Prenatal Vit-Fe Fumarate-FA (PRENATAL VITAMIN) 27-0.8 MG TABS Take 1 tablet by mouth daily. 30 tablet 3 05/25/2021   Blood Pressure Monitoring (BLOOD PRESSURE MONITOR AUTOMAT) DEVI 1 Device by Does not apply route daily. Automatic blood pressure cuff regular size. To monitor blood pressure regularly at home. ICD-10 code:Z34.90 1 each 0    Doxylamine-Pyridoxine 10-10 MG TBEC Take 1 tablet by mouth at bedtime. (Patient not taking: No sig reported) 60 tablet 1    metoCLOPramide (REGLAN) 10 MG tablet Take 1 tablet (10 mg total) by mouth every 8 (eight) hours as needed for nausea or vomiting. 30 tablet 0    metroNIDAZOLE (METROGEL) 0.75 % vaginal gel Place 1 Applicatorful vaginally at bedtime. Apply one applicatorful to vagina at bedtime for 5 days (Patient not taking: No sig reported) 70 g 1  Misc. Devices (GOJJI WEIGHT SCALE) MISC 1 Device by Does not apply route daily as needed. To weight self daily as needed at home. ICD-10 code: Z34.90 1 each 0     Review of Systems  Constitutional:  Negative for chills and fever.  Eyes:  Negative for visual disturbance.  Respiratory:  Positive for cough (Cold).   Gastrointestinal:  Negative for abdominal pain, constipation, diarrhea, nausea and vomiting.  Genitourinary:  Positive for dysuria (Burning), vaginal bleeding and vaginal discharge. Negative for difficulty urinating.  Neurological:  Negative for dizziness, light-headedness and headaches.  Physical Exam   Blood pressure 118/65, pulse 70, resp. rate 18, last menstrual period 12/11/2020.  Physical Exam Vitals reviewed. Exam conducted with a chaperone present.  Constitutional:      Appearance: Normal appearance.  HENT:     Head: Normocephalic and atraumatic.  Eyes:     Conjunctiva/sclera: Conjunctivae normal.  Cardiovascular:     Rate and Rhythm: Normal rate and regular  rhythm.  Pulmonary:     Effort: Pulmonary effort is normal.  Abdominal:     Palpations: Abdomen is soft.     Tenderness: There is no abdominal tenderness.  Genitourinary:    General: Normal vulva.     Comments: Speculum Exam: -Normal External Genitalia: Non tender, no apparent discharge at introitus.  -Vaginal Vault: Pink mucosa with good rugae. Moderate amt pinkish discharge. Malodor present -wet prep collected -Cervix:Pink, no lesions, cysts, or polyps. Friable. Active bleeding noted on external surface at 12'oclock.  Scant petechia of cervix. Appears closed. No active bleeding from os-GC/CT collected -Bimanual Exam:  Closed  Musculoskeletal:        General: Normal range of motion.     Cervical back: Normal range of motion.  Skin:    General: Skin is warm and dry.  Neurological:     Mental Status: She is alert and oriented to person, place, and time.  Psychiatric:        Mood and Affect: Mood normal.        Behavior: Behavior normal.        Thought Content: Thought content normal.    Fetal Assessment 145 bpm, Mod Var, -Decels, +10x10 Accels Toco: No ctx graphed  MAU Course   Results for orders placed or performed during the hospital encounter of 05/25/21 (from the past 24 hour(s))  Wet prep, genital     Status: Abnormal   Collection Time: 05/25/21  1:05 AM   Specimen: Vaginal  Result Value Ref Range   Yeast Wet Prep HPF POC PRESENT (A) NONE SEEN   Trich, Wet Prep PRESENT (A) NONE SEEN   Clue Cells Wet Prep HPF POC PRESENT (A) NONE SEEN   WBC, Wet Prep HPF POC >=10 (A) <10   Sperm NONE SEEN   Urinalysis, Routine w reflex microscopic Urine, Clean Catch     Status: Abnormal   Collection Time: 05/25/21  1:05 AM  Result Value Ref Range   Color, Urine YELLOW YELLOW   APPearance HAZY (A) CLEAR   Specific Gravity, Urine 1.026 1.005 - 1.030   pH 6.0 5.0 - 8.0   Glucose, UA NEGATIVE NEGATIVE mg/dL   Hgb urine dipstick LARGE (A) NEGATIVE   Bilirubin Urine NEGATIVE NEGATIVE    Ketones, ur NEGATIVE NEGATIVE mg/dL   Protein, ur NEGATIVE NEGATIVE mg/dL   Nitrite NEGATIVE NEGATIVE   Leukocytes,Ua LARGE (A) NEGATIVE   RBC / HPF 21-50 0 - 5 RBC/hpf   WBC, UA >50 (H) 0 - 5 WBC/hpf  Bacteria, UA FEW (A) NONE SEEN   Squamous Epithelial / LPF 6-10 0 - 5   Mucus PRESENT    Trichomonas, UA PRESENT (A) NONE SEEN   No results found.  MDM PE Labs:Wet Prep, GC/CT EFM Treatment Assessment and Plan  30 year old G3P1011  SIUP at 27.6 weeks Cat I FT Vaginal Discharge  -POC reviewed -Exam performed and findings discussed. -Informed that findings are highly suspicious for infectious process -Cultures collected and pending. -NST reactive for GA -We will await results.  Cherre Robins MSN, CNM 05/25/2021, 12:51 AM   Reassessment (1:57 AM) Trichomonas  -Results return as above. -Provider to bedside to discuss.  Patient informed of findings and treatment. -Patient offered and accepts EPT.  Prescription written for Zara Chess, DOB [birth year] -Patient informed of initial dose of metronidazole now and will complete remainder at home -Diagnosis added to problem list and plan for TOC in 3-4 weeks as appropriate. -We will also send prescription for Terazol cream. -Patient given information regarding trichomonas in AVS. -Precautions given. -As a follow-up as scheduled. -Encouraged to call primary office or return to MAU if symptoms worsen or with the onset of new symptoms. -Discharged to home in stable condition.  Cherre Robins MSN, CNM Advanced Practice Provider, [facility] [employer]

## 2021-05-25 NOTE — MAU Note (Signed)
EMS arrival. Pt reported that when she went to [facility] tonight she had some pink spotting. Denies any pain or cramping no recent intercourse. Good fetal movement felt. Pt also reports that she has had some vaginal irritation for the past 2 days and thought she might have a yeast infection. Was going to call office on Monday if symptoms persisted.

## 2021-05-27 LAB — GC/CHLAMYDIA PROBE AMP (~~LOC~~) NOT AT ARMC
Chlamydia: NEGATIVE
Comment: NEGATIVE
Comment: NORMAL
Neisseria Gonorrhea: NEGATIVE

## 2021-05-31 ENCOUNTER — Encounter: Payer: Self-pay | Admitting: Certified Nurse Midwife

## 2021-06-03 ENCOUNTER — Other Ambulatory Visit: Payer: Self-pay | Admitting: *Deleted

## 2021-06-03 ENCOUNTER — Other Ambulatory Visit: Payer: Medicaid Other

## 2021-06-03 DIAGNOSIS — O0990 Supervision of high risk pregnancy, unspecified, unspecified trimester: Secondary | ICD-10-CM

## 2021-06-20 ENCOUNTER — Encounter: Payer: Medicaid Other | Admitting: Family Medicine

## 2021-06-25 ENCOUNTER — Encounter: Payer: Self-pay | Admitting: Obstetrics and Gynecology

## 2021-06-25 ENCOUNTER — Other Ambulatory Visit: Payer: Self-pay

## 2021-06-25 ENCOUNTER — Other Ambulatory Visit: Payer: Self-pay | Admitting: Obstetrics and Gynecology

## 2021-06-25 ENCOUNTER — Ambulatory Visit (INDEPENDENT_AMBULATORY_CARE_PROVIDER_SITE_OTHER): Payer: Medicaid Other | Admitting: Obstetrics and Gynecology

## 2021-06-25 VITALS — BP 129/58 | HR 89 | Wt 230.0 lb

## 2021-06-25 DIAGNOSIS — G129 Spinal muscular atrophy, unspecified: Secondary | ICD-10-CM | POA: Insufficient documentation

## 2021-06-25 DIAGNOSIS — O0990 Supervision of high risk pregnancy, unspecified, unspecified trimester: Secondary | ICD-10-CM

## 2021-06-25 DIAGNOSIS — O09893 Supervision of other high risk pregnancies, third trimester: Secondary | ICD-10-CM | POA: Insufficient documentation

## 2021-06-25 DIAGNOSIS — Z3A32 32 weeks gestation of pregnancy: Secondary | ICD-10-CM

## 2021-06-25 DIAGNOSIS — Z23 Encounter for immunization: Secondary | ICD-10-CM

## 2021-06-25 DIAGNOSIS — O361920 Maternal care for other isoimmunization, second trimester, not applicable or unspecified: Secondary | ICD-10-CM

## 2021-06-25 DIAGNOSIS — Z8759 Personal history of other complications of pregnancy, childbirth and the puerperium: Secondary | ICD-10-CM

## 2021-06-25 DIAGNOSIS — Z98891 History of uterine scar from previous surgery: Secondary | ICD-10-CM

## 2021-06-25 DIAGNOSIS — Z8632 Personal history of gestational diabetes: Secondary | ICD-10-CM

## 2021-06-25 DIAGNOSIS — A599 Trichomoniasis, unspecified: Secondary | ICD-10-CM

## 2021-06-25 HISTORY — DX: Supervision of other high risk pregnancies, third trimester: O09.893

## 2021-06-25 HISTORY — DX: Spinal muscular atrophy, unspecified: G12.9

## 2021-06-25 NOTE — Progress Notes (Signed)
° ° ° °  PRENATAL VISIT NOTE  Subjective:  Brooke Robertson is a 31 y.o. G3P1011 at [redacted]w[redacted]d being seen today for ongoing prenatal care.  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  Patient reports no complaints.  Contractions: Not present. Vag. Bleeding: None.  Movement: Present. Denies leaking of fluid.   The following portions of the patient's history were reviewed and updated as appropriate: allergies, current medications, past family history, past medical history, past social history, past surgical history and problem list.   Objective:   Vitals:   06/25/21 1324  BP: (!) 129/58  Pulse: 89  Weight: 230 lb (104.3 kg)    Fetal Status: Fetal Heart Rate (bpm): 89   Movement: Present     General:  Alert, oriented and cooperative. Patient is in no acute distress.  Skin: Skin is warm and dry. No rash noted.   Cardiovascular: Normal heart rate noted  Respiratory: Normal respiratory effort, no problems with respiration noted  Abdomen: Soft, gravid, appropriate for gestational age.  Pain/Pressure: Present     Pelvic: Cervical exam deferred        Extremities: Normal range of motion.  Edema: None  Mental Status: Normal mood and affect. Normal behavior. Normal judgment and thought content.   Assessment and Plan:  Pregnancy: G3P1011 at [redacted]w[redacted]d 1. Supervision of high risk pregnancy, antepartum Pt missed December appts. Okay with doing a 1hr today. Has surveillance growth in two weeks for anti-M antibodies - Glucose Tolerance, 1 Hour - Tdap vaccine greater than or equal to 7yo IM - CBC - RPR - HIV antibody (with reflex) - Antibody  screen  2. [redacted] weeks gestation of pregnancy  3. History of cesarean delivery 03/2015 pLTCS at 37wks due to non compliance with GDMa2 (glyburide), suspected macrosomia. Pt leaning towards tolac but not sure. Uterine rupture risk d/w her. F/u nv after u/s and GTT today. Needs to sign tolac vs rpt c/s form today  4. History of pre-eclampsia On low dose asa  5. 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 If >1:16, let mfm know  6. History of gestational diabetes  7. Trichomoniasis Toc late jan/early feb  Preterm labor symptoms and general obstetric precautions including but not limited to vaginal bleeding, contractions, leaking of fluid and fetal movement were reviewed in detail with the patient. Please refer to After Visit Summary for other counseling recommendations.   Return in 13 days (on 07/08/2021) for in person, md visit, high risk ob.  Future Appointments  Date Time Provider [place]  07/08/2021  9:55 AM Harolyn Rutherford Sallyanne Havers, MD [facility]  07/08/2021 12:45 PM [facility] NURSE [facility]  07/08/2021  1:00 PM [facility] US1 [facility]    Aletha Halim, MD

## 2021-06-26 LAB — GLUCOSE TOLERANCE, 1 HOUR: Glucose, 1Hr PP: 136 mg/dL (ref 70–199)

## 2021-06-27 ENCOUNTER — Encounter: Payer: Self-pay | Admitting: Obstetrics and Gynecology

## 2021-06-27 DIAGNOSIS — O99810 Abnormal glucose complicating pregnancy: Secondary | ICD-10-CM | POA: Insufficient documentation

## 2021-06-27 LAB — CBC
Hematocrit: 36.3 % (ref 34.0–46.6)
Hemoglobin: 12.8 g/dL (ref 11.1–15.9)
MCH: 32.3 pg (ref 26.6–33.0)
MCHC: 35.3 g/dL (ref 31.5–35.7)
MCV: 92 fL (ref 79–97)
Platelets: 295 10*3/uL (ref 150–450)
RBC: 3.96 x10E6/uL (ref 3.77–5.28)
RDW: 11.7 % (ref 11.7–15.4)
WBC: 9.7 10*3/uL (ref 3.4–10.8)

## 2021-06-27 LAB — RPR: RPR Ser Ql: NONREACTIVE

## 2021-06-27 LAB — HIV ANTIBODY (ROUTINE TESTING W REFLEX): HIV Screen 4th Generation wRfx: NONREACTIVE

## 2021-06-27 LAB — AB SCR+ANTIBODY ID: Antibody Screen: POSITIVE — AB

## 2021-06-27 LAB — ANTIBODY SCREEN

## 2021-07-01 ENCOUNTER — Ambulatory Visit: Payer: Medicaid Other

## 2021-07-03 ENCOUNTER — Other Ambulatory Visit: Payer: Self-pay

## 2021-07-03 DIAGNOSIS — Z8632 Personal history of gestational diabetes: Secondary | ICD-10-CM

## 2021-07-05 ENCOUNTER — Other Ambulatory Visit: Payer: Medicaid Other

## 2021-07-05 ENCOUNTER — Other Ambulatory Visit: Payer: Self-pay

## 2021-07-05 DIAGNOSIS — Z8632 Personal history of gestational diabetes: Secondary | ICD-10-CM

## 2021-07-06 LAB — GESTATIONAL GLUCOSE TOLERANCE
Glucose, Fasting: 92 mg/dL (ref 70–94)
Glucose, GTT - 1 Hour: 170 mg/dL (ref 70–179)
Glucose, GTT - 2 Hour: 145 mg/dL (ref 70–154)
Glucose, GTT - 3 Hour: 103 mg/dL (ref 70–139)

## 2021-07-08 ENCOUNTER — Other Ambulatory Visit (HOSPITAL_COMMUNITY)
Admission: RE | Admit: 2021-07-08 | Discharge: 2021-07-08 | Disposition: A | Discharge status: Home / Self Care | Payer: Medicaid Other | Source: Ambulatory Visit | Attending: Obstetrics & Gynecology | Admitting: Obstetrics & Gynecology

## 2021-07-08 ENCOUNTER — Other Ambulatory Visit: Payer: Self-pay

## 2021-07-08 ENCOUNTER — Other Ambulatory Visit: Payer: Self-pay | Admitting: *Deleted

## 2021-07-08 ENCOUNTER — Ambulatory Visit: Payer: Medicaid Other | Admitting: *Deleted

## 2021-07-08 ENCOUNTER — Ambulatory Visit: Payer: Medicaid Other | Attending: Obstetrics

## 2021-07-08 ENCOUNTER — Encounter: Payer: Self-pay | Admitting: Obstetrics & Gynecology

## 2021-07-08 ENCOUNTER — Encounter: Payer: Self-pay | Admitting: *Deleted

## 2021-07-08 ENCOUNTER — Ambulatory Visit (INDEPENDENT_AMBULATORY_CARE_PROVIDER_SITE_OTHER): Payer: Medicaid Other | Admitting: Obstetrics & Gynecology

## 2021-07-08 VITALS — BP 115/55 | HR 67

## 2021-07-08 VITALS — BP 119/79 | HR 78 | Wt 238.0 lb

## 2021-07-08 DIAGNOSIS — O0990 Supervision of high risk pregnancy, unspecified, unspecified trimester: Secondary | ICD-10-CM | POA: Insufficient documentation

## 2021-07-08 DIAGNOSIS — O34219 Maternal care for unspecified type scar from previous cesarean delivery: Secondary | ICD-10-CM

## 2021-07-08 DIAGNOSIS — O09292 Supervision of pregnancy with other poor reproductive or obstetric history, second trimester: Secondary | ICD-10-CM | POA: Diagnosis present

## 2021-07-08 DIAGNOSIS — O99212 Obesity complicating pregnancy, second trimester: Secondary | ICD-10-CM | POA: Diagnosis present

## 2021-07-08 DIAGNOSIS — Z3A34 34 weeks gestation of pregnancy: Secondary | ICD-10-CM

## 2021-07-08 DIAGNOSIS — O23593 Infection of other part of genital tract in pregnancy, third trimester: Secondary | ICD-10-CM | POA: Insufficient documentation

## 2021-07-08 DIAGNOSIS — Z8632 Personal history of gestational diabetes: Secondary | ICD-10-CM

## 2021-07-08 DIAGNOSIS — A5901 Trichomonal vulvovaginitis: Secondary | ICD-10-CM | POA: Diagnosis present

## 2021-07-08 DIAGNOSIS — Z98891 History of uterine scar from previous surgery: Secondary | ICD-10-CM

## 2021-07-08 DIAGNOSIS — Z8759 Personal history of other complications of pregnancy, childbirth and the puerperium: Secondary | ICD-10-CM

## 2021-07-08 DIAGNOSIS — O361930 Maternal care for other isoimmunization, third trimester, not applicable or unspecified: Secondary | ICD-10-CM

## 2021-07-08 NOTE — Progress Notes (Signed)
° °  PRENATAL VISIT NOTE  Subjective:  Brooke Robertson is a 31 y.o. G3P1011 at [redacted]w[redacted]d being seen today for ongoing prenatal care.  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  Patient reports no complaints.  Contractions: Not present. Vag. Bleeding: None.  Movement: Present. Denies leaking of fluid.   The following portions of the patient's history were reviewed and updated as appropriate: allergies, current medications, past family history, past medical history, past social history, past surgical history and problem list.   Objective:   Vitals:   07/08/21 0948  BP: 119/79  Pulse: 78  Weight: 238 lb (108 kg)    Fetal Status: Fetal Heart Rate (bpm): 143   Movement: Present     General:  Alert, oriented and cooperative. Patient is in no acute distress.  Skin: Skin is warm and dry. No rash noted.   Cardiovascular: Normal heart rate noted  Respiratory: Normal respiratory effort, no problems with respiration noted  Abdomen: Soft, gravid, appropriate for gestational age.  Pain/Pressure: Present     Pelvic: Cervical exam deferred        Extremities: Normal range of motion.  Edema: None  Mental Status: Normal mood and affect. Normal behavior. Normal judgment and thought content.   Assessment and Plan:  Pregnancy: G3P1011 at [redacted]w[redacted]d 1. History of cesarean delivery Counseled regarding TOLAC vs RCS; risks/benefits discussed in detail. All questions answered.  Patient elects for TOLAC, consent signed 07/08/2021.  2. Anti-M isoimmunization affecting pregnancy  in third trimester Too low to titer this month, will recheck next visit. Get MFM involved in titer >1:16.  3. History of pre-eclampsia Stable BP  4. Trichomonal vaginitis during pregnancy in third trimester TOC done today, will follow up results and manage accordingly. - Cervicovaginal ancillary only  5. [redacted] weeks gestation of pregnancy 6. Supervision of high risk pregnancy, antepartum GC/Chlam done today, will follow up results and manage accordingly. - Cervicovaginal ancillary only Preterm labor symptoms and general obstetric precautions including but not limited to vaginal bleeding, contractions, leaking of fluid and fetal movement were reviewed in detail with the patient. Please refer to After Visit Summary for other counseling recommendations.   Return in about 2 weeks (around 07/22/2021) for GBS culture, OFFICE OB VISIT (MD only).  Future Appointments  Date Time Provider [place]  07/08/2021 12:45 PM [facility] NURSE [facility] [place]  07/08/2021  1:00 PM [facility] US1 [facility] [place]    Verita Schneiders, MD

## 2021-07-08 NOTE — Patient Instructions (Signed)
Return to office for any scheduled appointments. Call the office or go to the MAU at [facility] if:  You begin to have strong, frequent contractions  Your water breaks.  Sometimes it is a big gush of fluid, sometimes it is just a trickle that keeps getting your panties wet or running down your legs  You have vaginal bleeding.  It is normal to have a small amount of spotting if your cervix was checked.   You do not feel your baby moving like normal.  If you do not, get something to eat and drink and lay down and focus on feeling your baby move.   If your baby is still not moving like normal, you should call the office or go to MAU.  Any other obstetric concerns.   

## 2021-07-09 LAB — CERVICOVAGINAL ANCILLARY ONLY
Chlamydia: NEGATIVE
Comment: NEGATIVE
Comment: NEGATIVE
Comment: NORMAL
Neisseria Gonorrhea: NEGATIVE
Trichomonas: NEGATIVE

## 2021-07-10 ENCOUNTER — Encounter: Payer: Self-pay | Admitting: *Deleted

## 2021-07-22 ENCOUNTER — Other Ambulatory Visit: Payer: Self-pay

## 2021-07-22 ENCOUNTER — Ambulatory Visit (INDEPENDENT_AMBULATORY_CARE_PROVIDER_SITE_OTHER): Payer: Medicaid Other | Admitting: Obstetrics & Gynecology

## 2021-07-22 VITALS — BP 123/81 | HR 87 | Wt 237.7 lb

## 2021-07-22 DIAGNOSIS — O0990 Supervision of high risk pregnancy, unspecified, unspecified trimester: Secondary | ICD-10-CM

## 2021-07-22 DIAGNOSIS — Z98891 History of uterine scar from previous surgery: Secondary | ICD-10-CM

## 2021-07-22 LAB — OB RESULTS CONSOLE GC/CHLAMYDIA: Gonorrhea: NEGATIVE

## 2021-07-22 NOTE — Progress Notes (Signed)
Pt states sharp pain in lower abdomen & burning sensation in middle of back.

## 2021-07-22 NOTE — Progress Notes (Signed)
° °  PRENATAL VISIT NOTE  Subjective:  Brooke Robertson is a 31 y.o. G3P1011 at [redacted]w[redacted]d being seen today for ongoing prenatal care.  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  Patient reports backache.  Contractions: Irritability. Vag. Bleeding: None.  Movement: Present. Denies leaking of fluid.   The following portions of the patient's history were reviewed and updated as appropriate: allergies, current medications, past family history, past medical history, past social history, past surgical history and problem list.   Objective:   Vitals:   07/22/21 0905  BP: 123/81  Pulse: 87  Weight: 237 lb 11.2 oz (107.8 kg)    Fetal Status: Fetal Heart Rate (bpm): 140   Movement: Present     General:  Alert, oriented and cooperative. Patient is in no acute distress.  Skin: Skin is warm and dry. No rash noted.   Cardiovascular: Normal heart rate noted  Respiratory: Normal respiratory effort, no problems with respiration noted  Abdomen: Soft, gravid, appropriate for gestational age.  Pain/Pressure: Present     Pelvic: Cervical exam performed in the presence of a chaperone Dilation: Closed Effacement (%): 20 Station: Ballotable  Extremities: Normal range of motion.  Edema: None  Mental Status: Normal mood and affect. Normal behavior. Normal judgment and thought content.   Assessment and Plan:  Pregnancy: G3P1011 at [redacted]w[redacted]d 1. Supervision of high risk pregnancy, antepartum Routine test - Culture, beta strep (group b only)  2. History of cesarean  delivery Plans TOLAC  Preterm labor symptoms and general obstetric precautions including but not limited to vaginal bleeding, contractions, leaking of fluid and fetal movement were reviewed in detail with the patient. Please refer to After Visit Summary for other counseling recommendations.   Return in about 1 week (around 07/29/2021).  Future Appointments  Date Time Provider [place]  08/05/2021 12:30 PM [facility] NURSE [facility]  08/05/2021 12:45 PM [facility] US[facility]    Emeterio Reeve, MD

## 2021-07-26 LAB — CULTURE, BETA STREP (GROUP B ONLY): Strep Gp B Culture: NEGATIVE

## 2021-07-29 ENCOUNTER — Ambulatory Visit (INDEPENDENT_AMBULATORY_CARE_PROVIDER_SITE_OTHER): Payer: Medicaid Other | Admitting: Obstetrics and Gynecology

## 2021-07-29 ENCOUNTER — Encounter: Payer: Self-pay | Admitting: Obstetrics and Gynecology

## 2021-07-29 ENCOUNTER — Other Ambulatory Visit: Payer: Self-pay

## 2021-07-29 VITALS — BP 139/82 | HR 84 | Wt 240.7 lb

## 2021-07-29 DIAGNOSIS — O0990 Supervision of high risk pregnancy, unspecified, unspecified trimester: Secondary | ICD-10-CM

## 2021-07-29 DIAGNOSIS — O23593 Infection of other part of genital tract in pregnancy, third trimester: Secondary | ICD-10-CM

## 2021-07-29 DIAGNOSIS — Z3A37 37 weeks gestation of pregnancy: Secondary | ICD-10-CM

## 2021-07-29 DIAGNOSIS — A5901 Trichomonal vulvovaginitis: Secondary | ICD-10-CM

## 2021-07-29 DIAGNOSIS — Z8759 Personal history of other complications of pregnancy, childbirth and the puerperium: Secondary | ICD-10-CM

## 2021-07-29 DIAGNOSIS — O361930 Maternal care for other isoimmunization, third trimester, not applicable or unspecified: Secondary | ICD-10-CM

## 2021-07-29 DIAGNOSIS — Z98891 History of uterine scar from previous surgery: Secondary | ICD-10-CM

## 2021-07-29 NOTE — Progress Notes (Signed)
° °  PRENATAL VISIT NOTE  Subjective:  Brooke Robertson is a 31 y.o. G3P1011 at [redacted]w[redacted]d being seen today for ongoing prenatal care.  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  Patient reports  cramping and low back pain .  Contractions: Irritability. Vag. Bleeding: None.  Movement: Present. Denies leaking of fluid.   The following portions of the patient's history were reviewed and updated as appropriate: allergies, current medications, past family history, past medical history, past social history, past surgical history and problem list.   Objective:   Vitals:   07/29/21 1433  BP: 139/82  Pulse: 84  Weight: 240 lb 11.2 oz (109.2 kg)    Fetal Status: Fetal Heart Rate (bpm): 141 Fundal Height: 39 cm Movement: Present  Presentation: Vertex  General:  Alert, oriented and cooperative. Patient is in no acute distress.  Skin: Skin is warm and dry. No rash noted.   Cardiovascular: Normal heart rate noted  Respiratory: Normal respiratory effort, no problems with respiration noted  Abdomen: Soft, gravid, appropriate for gestational age.  Pain/Pressure: Present     Pelvic: Cervical exam performed in the presence of a chaperone Dilation: Closed Effacement (%): 20    Extremities: Normal range of motion.  Edema: None  Mental Status: Normal mood and affect. Normal behavior. Normal judgment and thought content.   Assessment and Plan:  Pregnancy: G3P1011 at [redacted]w[redacted]d 1. Trichomonal vaginitis during pregnancy in third trimester Toc neg  2. Supervision of high risk pregnancy, antepartum Watch  BPs GBS neg  3. BMI 40s F/u surveillance growth u/s next week  4. 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 History of cesarean delivery Desires tolac. Form already signed  Term labor symptoms and general obstetric precautions including but not limited to vaginal bleeding, contractions, leaking of fluid and fetal movement were reviewed in detail with the patient. Please refer to After Visit Summary for other counseling recommendations.    Future Appointments  Date Time Provider [place]  07/29/2021  2:55 PM Aletha Halim, MD [facility]  08/05/2021 12:30 PM [facility] NURSE [facility]  08/05/2021 12:45 PM [facility] US4 [facility]  08/05/2021  2:15 PM Chancy Milroy, MD [facility]  08/12/2021 10:15 AM Chancy Milroy, MD [facility]    Aletha Halim, MD

## 2021-07-29 NOTE — Progress Notes (Signed)
Pt states has been having a lot abdominal cramps & back aches.

## 2021-07-30 LAB — ANTIBODY SCREEN: Antibody Screen: NEGATIVE

## 2021-08-05 ENCOUNTER — Ambulatory Visit (INDEPENDENT_AMBULATORY_CARE_PROVIDER_SITE_OTHER): Payer: Medicaid Other | Admitting: Obstetrics and Gynecology

## 2021-08-05 ENCOUNTER — Other Ambulatory Visit: Payer: Self-pay

## 2021-08-05 ENCOUNTER — Encounter: Payer: Self-pay | Admitting: Obstetrics and Gynecology

## 2021-08-05 ENCOUNTER — Ambulatory Visit: Payer: Medicaid Other | Admitting: *Deleted

## 2021-08-05 ENCOUNTER — Ambulatory Visit: Payer: Medicaid Other | Attending: Obstetrics and Gynecology

## 2021-08-05 VITALS — BP 131/67 | HR 70 | Wt 245.8 lb

## 2021-08-05 VITALS — BP 131/67 | HR 70

## 2021-08-05 DIAGNOSIS — O0990 Supervision of high risk pregnancy, unspecified, unspecified trimester: Secondary | ICD-10-CM | POA: Insufficient documentation

## 2021-08-05 DIAGNOSIS — O99213 Obesity complicating pregnancy, third trimester: Secondary | ICD-10-CM | POA: Diagnosis not present

## 2021-08-05 DIAGNOSIS — O361930 Maternal care for other isoimmunization, third trimester, not applicable or unspecified: Secondary | ICD-10-CM

## 2021-08-05 DIAGNOSIS — Z8632 Personal history of gestational diabetes: Secondary | ICD-10-CM | POA: Diagnosis not present

## 2021-08-05 DIAGNOSIS — E668 Other obesity: Secondary | ICD-10-CM | POA: Diagnosis not present

## 2021-08-05 DIAGNOSIS — O34219 Maternal care for unspecified type scar from previous cesarean delivery: Secondary | ICD-10-CM | POA: Insufficient documentation

## 2021-08-05 DIAGNOSIS — Z3A38 38 weeks gestation of pregnancy: Secondary | ICD-10-CM

## 2021-08-05 DIAGNOSIS — Z98891 History of uterine scar from previous surgery: Secondary | ICD-10-CM | POA: Diagnosis not present

## 2021-08-05 NOTE — Progress Notes (Signed)
Subjective:  Brooke Robertson is a 31 y.o. G3P1011 at [redacted]w[redacted]d being seen today for ongoing prenatal care.  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  Patient reports general discomforts of pregnancy.  Contractions: Not present. Vag. Bleeding: None.  Movement: Present. Denies leaking of fluid.   The following portions of the patient's history were reviewed and updated as appropriate: allergies, current medications, past family history, past medical history, past social history, past surgical history and problem list. Problem list updated.  Objective:   Vitals:   08/05/21 1335  BP: 131/67  Pulse: 70  Weight: 245 lb 12.8 oz (111.5 kg)    Fetal Status: Fetal Heart Rate (bpm): 138   Movement: Present     General:  Alert, oriented and cooperative. Patient is in no acute distress.  Skin: Skin is warm and dry. No rash noted.   Cardiovascular: Normal heart rate noted  Respiratory: Normal respiratory effort, no problems with respiration noted  Abdomen: Soft, gravid, appropriate for gestational age. Pain/Pressure: Absent     Pelvic:  Cervical exam deferred        Extremities: Normal range of motion.  Edema: Trace  Mental Status: Normal mood and affect. Normal behavior. Normal judgment and thought content.   Urinalysis:      Assessment and Plan:  Pregnancy: G3P1011 at [redacted]w[redacted]d  1. Supervision of high risk pregnancy, antepartum Stable Growth today 98% MFM recommends repeat c section Will schedule  2. 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 History of cesarean delivery See above  Term labor  symptoms and general obstetric precautions including but not limited to vaginal bleeding, contractions, leaking of fluid and fetal movement were reviewed in detail with the patient. Please refer to After Visit Summary for other counseling recommendations.  No follow-ups on file.   Chancy Milroy, MD

## 2021-08-05 NOTE — Patient Instructions (Signed)
Cesarean Delivery, Care After °The following information offers guidance on how to care for yourself after your procedure. Your health care provider may also give you more specific instructions. If you have problems or questions, contact your health care provider. °What can I expect after the procedure? °After the procedure, it is common to have: °A small amount of blood or clear fluid coming from the incision. °Some redness, swelling, and pain in your incision area. °Some abdominal pain and soreness. °Vaginal bleeding (lochia). Even though you did not have a vaginal delivery, you will still have vaginal bleeding and discharge. °Pelvic cramps. °Fatigue. °You may have pain, swelling, and discomfort in the tissue between your vagina and your anus (perineum) if: °Your C-section was unplanned, and you were allowed to labor and push. °An incision was made in the area (episiotomy) or the tissue tore during attempted vaginal delivery. °Follow these instructions at home: °Medicines °Take over-the-counter and prescription medicines only as told by your health care provider. °If you were prescribed an antibiotic medicine, take it as told by your health care provider. Do not stop taking the antibiotic even if you start to feel better. °Ask your health care provider if the medicine prescribed to you requires you to avoid driving or using machinery. °Incision care ° °Follow instructions from your health care provider about how to take care of your incision. Make sure you: °Wash your hands with soap and water for an least 20 seconds before and after you change your bandage (dressing). If soap and water are not available, use hand sanitizer. °If you have a dressing, change it or remove it as told by your health care provider. °Leave stitches (sutures), skin staples, skin glue, or adhesive strips in place. These skin closures may need to stay in place for 2 weeks or longer. If adhesive strip edges start to loosen and curl up, you  may trim the loose edges. Do not remove adhesive strips completely unless your health care provider tells you to do that. °Check your incision area every day for signs of infection. Check for: °More redness, swelling, or pain. °More fluid or blood. °Warmth. °Pus or a bad smell. °Do not take baths, swim, or use a hot tub until your health care provider approves. Ask your health care provider if you may take showers. °When you cough or sneeze, hug a pillow. This helps with pain and decreases the chance of your incision opening up (dehiscing). Do this until your incision heals. °Managing constipation °Your procedure may cause constipation. To prevent or treat constipation, you may need to: °Drink enough fluid to keep your urine pale yellow. °Take over-the-counter or prescription medicines. °Eat foods that are high in fiber, such as beans, whole grains, and fresh fruits and vegetables. °Limit foods that are high in fat and processed sugars, such as fried or sweet foods. °Activity ° °If possible, have someone help you care for your baby and help with household activities for at least a few days after you leave the hospital. °Rest as much as possible. Try to rest or take a nap while your baby is sleeping. °You may have to avoid lifting. Ask your health care provider how much you can safely lift. °Return to your normal activities as told by your health care provider. Ask your health care provider what activities are safe for you. °Talk with your health care provider about when you can engage in sexual activity. This may depend on your: °Risk of infection. °How fast you heal. °Comfort   and desire to engage in sexual activity. °Lifestyle °Do not drink alcohol. This is especially important if you are breastfeeding or taking pain medicine. °Do not use any products that contain nicotine or tobacco. These products include cigarettes, chewing tobacco, and vaping devices, such as e-cigarettes. If you need help quitting, ask your  health care provider. °General instructions °Do not use tampons or douches until your health care provider approves. °Wear loose, comfortable clothing and a supportive and well-fitting bra. °If you pass a blood clot, save it and call your health care provider to discuss. Do not flush blood clots down the toilet before you get instructions from your health care provider. °Keep all follow-up visits for you and your baby. This is important. °Contact a health care provider if: °You have: °A fever. °Dizziness or light-headedness. °Bad-smelling vaginal discharge. °A blood clot pass from your vagina. °Pus, blood, or a bad smell coming from your incision. °An incision that feels warm to the touch. °More redness, swelling, or pain around your incision. °Difficulty or pain when urinating. °Nausea or vomiting. °Little or no interest in activities you used to enjoy. °Your breasts turn red or become painful or hard. °You feel unusually sad or worried. °You have questions about caring for yourself or your baby. °You have redness, swelling, and pain in an arm or leg. °Get help right away if: °You have: °Pain that does not go away or get better with medicine. °Chest pain. °Trouble breathing. °Blurred vision, spots, or flashing lights in your vision. °Thoughts about hurting yourself or your baby. °New pain in your abdomen or in one of your legs. °A severe headache that does not get better with pain medicine. °You faint. °You bleed from your vagina so much that you fill more than one sanitary pad in one hour. Bleeding should not be heavier than your heaviest period. °These symptoms may be an emergency. Get help right away. Call 911. °Do not wait to see if the symptoms will go away. °Do not drive yourself to the hospital. °Get help right away if you feel like you may hurt yourself or others, or have thoughts about taking your own life. Go to your nearest emergency room or: °Call 911. °Call the National Suicide Prevention Lifeline at  1-800-273-8255 or 988. This is open 24 hours a day. °Text the Crisis Text Line at 741741. °Summary °After the procedure, it is common to have pain at your incision site, abdominal cramping, and slight bleeding from your vagina. °Check your incision area every day for signs of infection. °Tell your health care provider about any unusual symptoms. °Keep all follow-up visits for you and your baby. This is important. °This information is not intended to replace advice given to you by your health care provider. Make sure you discuss any questions you have with your health care provider. °Document Revised: 01/02/2021 Document Reviewed: 01/02/2021 °Elsevier Patient Education © 2022 [employer] ° °

## 2021-08-05 NOTE — Progress Notes (Signed)
Pt states MFM informed due to babys size 9lbs 2 oz, they suggest a Cesarean.

## 2021-08-06 ENCOUNTER — Encounter (HOSPITAL_COMMUNITY): Payer: Self-pay

## 2021-08-06 ENCOUNTER — Other Ambulatory Visit: Payer: Self-pay | Admitting: Obstetrics & Gynecology

## 2021-08-06 DIAGNOSIS — Z98891 History of uterine scar from previous surgery: Secondary | ICD-10-CM

## 2021-08-06 NOTE — Patient Instructions (Signed)
Brooke Robertson  08/06/2021   Your procedure is scheduled on:  08/11/2021  Arrive at 0730 at Entrance C on [address] at [facility]  and [employer]. You are invited to use the FREE valet parking or use the Visitor's parking deck.  Pick up the phone at the desk and dial [phone].  Call this number if you have problems the morning of surgery: [phone]  Remember:   Do not eat food:(After Midnight) Desps de medianoche.  Do not drink clear liquids: (After Midnight) Desps de medianoche.  Take these medicines the morning of surgery with A SIP OF WATER:  none   Do not wear jewelry, make-up or nail polish.  Do not wear lotions, powders, or perfumes. Do not wear deodorant.  Do not shave 48 hours prior to surgery.  Do not bring valuables to the hospital.  [facility] is not   responsible for any belongings or valuables brought to the hospital.  Contacts, dentures or bridgework may not be worn into surgery.  Leave suitcase in the car. After surgery it may be brought to your room.  For patients admitted to the hospital, checkout time is 11:00 AM the day of              discharge.      Please read over the following fact sheets that you were given:     Preparing for Surgery

## 2021-08-08 NOTE — H&P (Signed)
OBSTETRIC ADMISSION HISTORY AND PHYSICAL  Brooke Robertson is a 31 y.o. female G63P1011 with IUP at [redacted]w[redacted]d by early [place] presenting for scheduled repeat cesarean section. She reports +FMs, no LOF, no VB, no blurry vision, headaches, peripheral edema, or RUQ pain.  She plans on breast feeding. She requests Nexplanon for birth control postpartum.  She received her prenatal care at [facility].   Dating: By [place] --->  Estimated Date of Delivery: 08/18/21  Sono:   @[redacted]w[redacted]d , normal anatomy, cephalic presentation, anterior placental lie, 4144 g, 98% EFW  Prenatal History/Complications:  Hx of cesarean section due to uncontrolled GDM and macrosomia  Anti-M isoimmunization  Hx of pre-eclampsia  Cystic fibrosis carrier  Increased SMA carrier risk Hx of GDM  Past Medical History: Past Medical History:  Diagnosis Date   Anxiety    Depression    Gestational diabetes mellitus (GDM)    History of gestational diabetes 04/09/2021   Obesity    Preeclampsia    Trichomonal vaginitis     Past Surgical History: Past Surgical History:  Procedure Laterality Date   CESAREAN SECTION     TONSILLECTOMY      Obstetrical History: OB History     Gravida  3   Para  1   Term  1   Preterm      AB  1   Living  1      SAB  1   IAB      Ectopic      Multiple      Live Births  1        Obstetric Comments  Failure to dilate         Social History Social History   Socioeconomic History   Marital status: Single    Spouse name: Not on file   Number of children: 1   Years of education: Not on file   Highest education level: High school graduate  Occupational History   Occupation: unemployed  Tobacco Use   Smoking status: Former    Types: Cigarettes    Quit date: 05/04/2016    Years since quitting: 5.2    Passive exposure: Never   Smokeless tobacco: Never  Vaping Use   Vaping Use: Never used  Substance and Sexual Activity   Alcohol use: Not Currently    Comment: none + UPT    Drug use: Not Currently    Types: Marijuana    Comment: last use a couple of weeks ago   Sexual activity: Yes    Birth control/protection: None  Other Topics Concern   Not on file  Social History Narrative   Not on file   Social Determinants of Health   Financial Resource Strain: Low Risk    Difficulty of Paying Living Expenses: Not hard at all  Food Insecurity: No Food Insecurity   Worried About 05/06/2016 in the Last Year: Never true   Ran Out of Food in the Last Year: Never true  Transportation Needs: No Transportation Needs   Lack of Transportation (Medical): No   Lack of Transportation (Non-Medical): No  Physical Activity: Insufficiently Active   Days of Exercise per Week: 3 days   Minutes of Exercise per Session: 30 min  Stress: No Stress Concern Present   Feeling of Stress : Only a little  Social Connections: Socially Isolated   Frequency of Communication with Friends and Family: More than three times a week   Frequency of Social Gatherings with Friends and Family: More  than three times a week   Attends Religious Services: Never   Active Member of Clubs or Organizations: No   Attends Engineer, structural: Never   Marital Status: Never married    Family History: Family History  Problem Relation Age of Onset   Diabetes Mother    Depression Father    Hypertension Father    Anxiety disorder Father    Heart disease Father     Allergies: No Known Allergies  Medications Prior to Admission  Medication Sig Dispense Refill Last Dose   aspirin EC 81 MG tablet Take 1 tablet (81 mg total) by mouth daily. Swallow whole. 30 tablet 11 08/10/2021   Prenatal Vit-Fe Fumarate-FA (PRENATAL VITAMIN) 27-0.8 MG TABS Take 1 tablet by mouth daily. 30 tablet 3 08/10/2021   Prenatal Vit-Fe Fumarate-FA (PRENATAL VITAMINS) 28-0.8 MG TABS Take 1 tablet by mouth daily.        Review of Systems  All systems reviewed and negative except as stated in HPI  Blood  pressure 116/78, pulse 73, temperature 98 F (36.7 C), temperature source Oral, resp. rate 18, height 5' (1.524 m), weight 108.9 kg, last menstrual period 12/11/2020, SpO2 97 %.  General appearance: alert, cooperative, and no distress Lungs: normal work of breathing on room air  Heart: normal rate, warm and well perfused  Abdomen: soft, non-tender, gravid. Healed cesarean horizontal scar about 4 cm under umbilicus. Extremities: no LE edema or calf tenderness to palpation   Prenatal labs: ABO, Rh: --/--/B POS (02/24 1010) Antibody: POS (02/24 1010) Rubella: 2.36 (09/06 1051) RPR: NON REACTIVE (02/24 1006)  HBsAg: Negative (09/06 1051)  HIV: Non Reactive (01/10 1432)  GBS: Negative/-- (02/06 1058)  1 hr Glucola 136; 3 hr GTT normal  Genetic screening - LR NIPS, increased carrier risk SMA, CF carrier  Anatomy US normal   Prenatal Transfer Tool  Maternal Diabetes: No Genetic Screening: LR NIPS, increased carrier risk SMA, CF carrier  Maternal Ultrasounds/Referrals: LGA, otherwise normal  Fetal Ultrasounds or other Referrals:  None Maternal Substance Abuse:  No Significant Maternal Medications:  None Significant Maternal Lab Results: Group B Strep negative  Results for orders placed or performed during the hospital encounter of 08/11/21 (from the past 24 hour(s))  Prepare RBC (crossmatch)   Collection Time: 08/11/21  7:08 AM  Result Value Ref Range   Order Confirmation      ORDER PROCESSED BY BLOOD BANK Performed at [facility], [address] [place], [place] [address]   Resp Panel by RT-PCR (Flu A&B, Covid) Nasopharyngeal Swab   Collection Time: 08/11/21  7:21 AM   Specimen: Nasopharyngeal Swab; Nasopharyngeal(NP) swabs in vial transport medium  Result Value Ref Range   SARS Coronavirus 2 by RT PCR NEGATIVE NEGATIVE   Influenza A by PCR NEGATIVE NEGATIVE   Influenza B by PCR NEGATIVE NEGATIVE  Glucose, capillary   Collection Time: 08/11/21  8:24 AM  Result Value  Ref Range   Glucose-Capillary 112 (H) 70 - 99 mg/dL    Patient Active Problem List   Diagnosis Date Noted   S/P cesarean section 08/11/2021   Abnormal glucose affecting pregnancy 06/27/2021   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 [id number] at [redacted]w[redacted]d here for scheduled repeat cesarean section.   The risks of surgery were discussed with the patient including but were not limited to: bleeding which may require transfusion or reoperation; infection which may require antibiotics; injury to bowel, bladder, ureters or other surrounding organs; injury to the fetus; need for additional procedures including hysterectomy in the event of a life-threatening hemorrhage; formation of adhesions; placental abnormalities with subsequent pregnancies; incisional problems; thromboembolic phenomenon and other postoperative/anesthesia complications.  Patient was told her previous incision will not be used, will do a Pfannenstiel incision. The patient concurred with the proposed plan, giving informed written consent for the procedure.   Patient has been NPO since midnight she will remain NPO for procedure. Anesthesia and OR aware. Preoperative prophylactic antibiotics and SCDs ordered on call to the OR.  To OR when ready.  #Pain: Per anesthesia, likely spinal  #ID:  GBS neg; pre-operative antibiotics ordered  #MOF: Breast  #MOC: Nexplanon     Jaynie Collins, MD, FACOG Obstetrician & Gynecologist, [facility] for [employer], [facility]

## 2021-08-09 ENCOUNTER — Other Ambulatory Visit (HOSPITAL_COMMUNITY)
Admission: RE | Admit: 2021-08-09 | Discharge: 2021-08-09 | Disposition: A | Discharge status: Home / Self Care | Payer: Medicaid Other | Source: Ambulatory Visit | Attending: Obstetrics & Gynecology | Admitting: Obstetrics & Gynecology

## 2021-08-09 ENCOUNTER — Other Ambulatory Visit: Payer: Self-pay

## 2021-08-09 DIAGNOSIS — Z98891 History of uterine scar from previous surgery: Secondary | ICD-10-CM | POA: Insufficient documentation

## 2021-08-09 DIAGNOSIS — Z01812 Encounter for preprocedural laboratory examination: Secondary | ICD-10-CM | POA: Insufficient documentation

## 2021-08-09 LAB — BASIC METABOLIC PANEL
Anion gap: 9 (ref 5–15)
BUN: 9 mg/dL (ref 6–20)
CO2: 19 mmol/L — ABNORMAL LOW (ref 22–32)
Calcium: 8.7 mg/dL — ABNORMAL LOW (ref 8.9–10.3)
Chloride: 107 mmol/L (ref 98–111)
Creatinine, Ser: 0.61 mg/dL (ref 0.44–1.00)
GFR, Estimated: >60 mL/min (ref >60)
Glucose, Bld: 104 mg/dL — ABNORMAL HIGH (ref 70–99)
Potassium: 4.1 mmol/L (ref 3.5–5.1)
Sodium: 135 mmol/L (ref 135–145)

## 2021-08-09 LAB — CBC
HCT: 39.1 % (ref 36.0–46.0)
Hemoglobin: 13.7 g/dL (ref 12.0–15.0)
MCH: 32.7 pg (ref 26.0–34.0)
MCHC: 35 g/dL (ref 30.0–36.0)
MCV: 93.3 fL (ref 80.0–100.0)
Platelets: 293 10*3/uL (ref 150–400)
RBC: 4.19 MIL/uL (ref 3.87–5.11)
RDW: 12.6 % (ref 11.5–15.5)
WBC: 9.2 10*3/uL (ref 4.0–10.5)
nRBC: 0 % (ref 0.0–0.2)

## 2021-08-09 LAB — RPR: RPR Ser Ql: NONREACTIVE

## 2021-08-10 ENCOUNTER — Encounter (HOSPITAL_COMMUNITY): Payer: Self-pay | Admitting: Obstetrics & Gynecology

## 2021-08-11 ENCOUNTER — Inpatient Hospital Stay (HOSPITAL_COMMUNITY)
Admission: RE | Admit: 2021-08-11 | Discharge: 2021-08-13 | DRG: 787 | Disposition: A | Discharge status: Home / Self Care | Payer: Medicaid Other | Attending: Obstetrics & Gynecology | Admitting: Obstetrics & Gynecology

## 2021-08-11 ENCOUNTER — Encounter (HOSPITAL_COMMUNITY): Payer: Self-pay | Admitting: Obstetrics & Gynecology

## 2021-08-11 ENCOUNTER — Other Ambulatory Visit: Payer: Self-pay

## 2021-08-11 ENCOUNTER — Inpatient Hospital Stay (HOSPITAL_COMMUNITY): Payer: Medicaid Other | Admitting: Anesthesiology

## 2021-08-11 ENCOUNTER — Encounter (HOSPITAL_COMMUNITY)
Admission: RE | Disposition: A | Discharge status: Home / Self Care | Payer: Self-pay | Source: Home / Self Care | Attending: Obstetrics & Gynecology

## 2021-08-11 DIAGNOSIS — O09893 Supervision of other high risk pregnancies, third trimester: Secondary | ICD-10-CM

## 2021-08-11 DIAGNOSIS — Z8759 Personal history of other complications of pregnancy, childbirth and the puerperium: Secondary | ICD-10-CM

## 2021-08-11 DIAGNOSIS — O99214 Obesity complicating childbirth: Secondary | ICD-10-CM | POA: Diagnosis present

## 2021-08-11 DIAGNOSIS — O3663X0 Maternal care for excessive fetal growth, third trimester, not applicable or unspecified: Secondary | ICD-10-CM | POA: Diagnosis present

## 2021-08-11 DIAGNOSIS — G129 Spinal muscular atrophy, unspecified: Secondary | ICD-10-CM | POA: Diagnosis present

## 2021-08-11 DIAGNOSIS — D62 Acute posthemorrhagic anemia: Secondary | ICD-10-CM | POA: Diagnosis not present

## 2021-08-11 DIAGNOSIS — O9081 Anemia of the puerperium: Secondary | ICD-10-CM | POA: Diagnosis not present

## 2021-08-11 DIAGNOSIS — Z3A39 39 weeks gestation of pregnancy: Secondary | ICD-10-CM | POA: Diagnosis not present

## 2021-08-11 DIAGNOSIS — Z98891 History of uterine scar from previous surgery: Secondary | ICD-10-CM

## 2021-08-11 DIAGNOSIS — O34211 Maternal care for low transverse scar from previous cesarean delivery: Principal | ICD-10-CM | POA: Diagnosis present

## 2021-08-11 DIAGNOSIS — Z87891 Personal history of nicotine dependence: Secondary | ICD-10-CM | POA: Diagnosis not present

## 2021-08-11 DIAGNOSIS — O361930 Maternal care for other isoimmunization, third trimester, not applicable or unspecified: Secondary | ICD-10-CM | POA: Diagnosis present

## 2021-08-11 DIAGNOSIS — Z141 Cystic fibrosis carrier: Secondary | ICD-10-CM

## 2021-08-11 DIAGNOSIS — O164 Unspecified maternal hypertension, complicating childbirth: Secondary | ICD-10-CM | POA: Diagnosis not present

## 2021-08-11 DIAGNOSIS — Z7982 Long term (current) use of aspirin: Secondary | ICD-10-CM

## 2021-08-11 DIAGNOSIS — O99810 Abnormal glucose complicating pregnancy: Secondary | ICD-10-CM

## 2021-08-11 DIAGNOSIS — O0990 Supervision of high risk pregnancy, unspecified, unspecified trimester: Secondary | ICD-10-CM

## 2021-08-11 DIAGNOSIS — Z30017 Encounter for initial prescription of implantable subdermal contraceptive: Secondary | ICD-10-CM | POA: Diagnosis not present

## 2021-08-11 DIAGNOSIS — Z3A38 38 weeks gestation of pregnancy: Secondary | ICD-10-CM

## 2021-08-11 DIAGNOSIS — Z20822 Contact with and (suspected) exposure to covid-19: Secondary | ICD-10-CM | POA: Diagnosis present

## 2021-08-11 DIAGNOSIS — O34219 Maternal care for unspecified type scar from previous cesarean delivery: Secondary | ICD-10-CM | POA: Diagnosis present

## 2021-08-11 LAB — RESP PANEL BY RT-PCR (FLU A&B, COVID) ARPGX2
Influenza A by PCR: NEGATIVE
Influenza B by PCR: NEGATIVE
SARS Coronavirus 2 by RT PCR: NEGATIVE

## 2021-08-11 LAB — PREPARE RBC (CROSSMATCH)

## 2021-08-11 LAB — GLUCOSE, CAPILLARY: Glucose-Capillary: 112 mg/dL — ABNORMAL HIGH (ref 70–99)

## 2021-08-11 SURGERY — Surgical Case
Anesthesia: Spinal

## 2021-08-11 MED ORDER — GABAPENTIN 100 MG PO CAPS
300.0000 mg | ORAL_CAPSULE | Freq: Two times a day (BID) | ORAL | Status: DC
  Administered 2021-08-12 – 2021-08-13 (×3): 300 mg via ORAL
  Filled 2021-08-11 (×3): qty 3

## 2021-08-11 MED ORDER — IBUPROFEN 600 MG PO TABS
600.0000 mg | ORAL_TABLET | Freq: Four times a day (QID) | ORAL | Status: DC
  Administered 2021-08-12 – 2021-08-13 (×5): 600 mg via ORAL
  Filled 2021-08-11 (×5): qty 1

## 2021-08-11 MED ORDER — PHENYLEPHRINE 40 MCG/ML (10ML) SYRINGE FOR IV PUSH (FOR BLOOD PRESSURE SUPPORT)
PREFILLED_SYRINGE | INTRAVENOUS | Status: DC | PRN
  Administered 2021-08-11 (×3): 40 ug via INTRAVENOUS

## 2021-08-11 MED ORDER — ONDANSETRON HCL 4 MG/2ML IJ SOLN
INTRAMUSCULAR | Status: AC
  Filled 2021-08-11: qty 2

## 2021-08-11 MED ORDER — LACTATED RINGERS IV SOLN: INTRAVENOUS | Status: DC

## 2021-08-11 MED ORDER — MAGNESIUM HYDROXIDE 400 MG/5ML PO SUSP: 30.0000 mL | ORAL | Status: DC | PRN

## 2021-08-11 MED ORDER — OXYTOCIN-SODIUM CHLORIDE 30-0.9 UT/500ML-% IV SOLN
INTRAVENOUS | Status: DC | PRN
  Administered 2021-08-11: 30 [IU] via INTRAVENOUS

## 2021-08-11 MED ORDER — WITCH HAZEL-GLYCERIN EX PADS: 1.0000 "application " | MEDICATED_PAD | CUTANEOUS | Status: DC | PRN

## 2021-08-11 MED ORDER — HYDROMORPHONE HCL 1 MG/ML IJ SOLN: 1.0000 mg | INTRAMUSCULAR | Status: DC | PRN

## 2021-08-11 MED ORDER — PHENYLEPHRINE HCL-NACL 20-0.9 MG/250ML-% IV SOLN
INTRAVENOUS | Status: DC | PRN
  Administered 2021-08-11: 80 ug/min via INTRAVENOUS

## 2021-08-11 MED ORDER — OXYCODONE-ACETAMINOPHEN 5-325 MG PO TABS: 2.0000 | ORAL_TABLET | ORAL | Status: DC | PRN

## 2021-08-11 MED ORDER — MORPHINE SULFATE (PF) 0.5 MG/ML IJ SOLN
INTRAMUSCULAR | Status: DC | PRN
  Administered 2021-08-11: .15 mg via INTRATHECAL

## 2021-08-11 MED ORDER — DEXAMETHASONE SODIUM PHOSPHATE 10 MG/ML IJ SOLN
INTRAMUSCULAR | Status: DC | PRN
  Administered 2021-08-11: 10 mg via INTRAVENOUS

## 2021-08-11 MED ORDER — NALOXONE HCL 0.4 MG/ML IJ SOLN: 0.4000 mg | INTRAMUSCULAR | Status: DC | PRN

## 2021-08-11 MED ORDER — SODIUM CHLORIDE 0.9 % IV SOLN
2.0000 g | INTRAVENOUS | Status: AC
  Administered 2021-08-11: 2 g via INTRAVENOUS

## 2021-08-11 MED ORDER — SCOPOLAMINE 1 MG/3DAYS TD PT72
MEDICATED_PATCH | TRANSDERMAL | Status: AC
  Filled 2021-08-11: qty 1

## 2021-08-11 MED ORDER — ACETAMINOPHEN 500 MG PO TABS
1000.0000 mg | ORAL_TABLET | ORAL | Status: AC
  Administered 2021-08-11: 1000 mg via ORAL

## 2021-08-11 MED ORDER — KETOROLAC TROMETHAMINE 30 MG/ML IJ SOLN
30.0000 mg | Freq: Four times a day (QID) | INTRAMUSCULAR | Status: AC
  Administered 2021-08-12 (×2): 30 mg via INTRAVENOUS
  Filled 2021-08-11 (×3): qty 1

## 2021-08-11 MED ORDER — SODIUM CHLORIDE 0.9 % IR SOLN
Status: DC | PRN
  Administered 2021-08-11: 1

## 2021-08-11 MED ORDER — NALOXONE HCL 4 MG/10ML IJ SOLN
1.0000 ug/kg/h | INTRAVENOUS | Status: DC | PRN
  Filled 2021-08-11: qty 5

## 2021-08-11 MED ORDER — OXYTOCIN-SODIUM CHLORIDE 30-0.9 UT/500ML-% IV SOLN
INTRAVENOUS | Status: AC
  Filled 2021-08-11: qty 500

## 2021-08-11 MED ORDER — GABAPENTIN 300 MG PO CAPS
300.0000 mg | ORAL_CAPSULE | ORAL | Status: AC
Start: 2021-08-11 — End: 2021-08-11
  Administered 2021-08-11: 300 mg via ORAL

## 2021-08-11 MED ORDER — DIPHENHYDRAMINE HCL 50 MG/ML IJ SOLN: 12.5000 mg | INTRAMUSCULAR | Status: DC | PRN

## 2021-08-11 MED ORDER — POVIDONE-IODINE 10 % EX SWAB
2.0000 "application " | Freq: Once | CUTANEOUS | Status: AC
  Administered 2021-08-11: 2 via TOPICAL

## 2021-08-11 MED ORDER — SENNOSIDES-DOCUSATE SODIUM 8.6-50 MG PO TABS
2.0000 | ORAL_TABLET | Freq: Every day | ORAL | Status: DC
  Administered 2021-08-12 – 2021-08-13 (×2): 2 via ORAL
  Filled 2021-08-11 (×2): qty 2

## 2021-08-11 MED ORDER — MORPHINE SULFATE (PF) 0.5 MG/ML IJ SOLN
INTRAMUSCULAR | Status: AC
  Filled 2021-08-11: qty 10

## 2021-08-11 MED ORDER — ACETAMINOPHEN 500 MG PO TABS
ORAL_TABLET | ORAL | Status: AC
  Filled 2021-08-11: qty 2

## 2021-08-11 MED ORDER — BUPIVACAINE IN DEXTROSE 0.75-8.25 % IT SOLN
INTRATHECAL | Status: DC | PRN
Start: 2021-08-11 — End: 2021-08-11
  Administered 2021-08-11: 1.4 mL via INTRATHECAL

## 2021-08-11 MED ORDER — FENTANYL CITRATE (PF) 100 MCG/2ML IJ SOLN: 25.0000 ug | INTRAMUSCULAR | Status: DC | PRN

## 2021-08-11 MED ORDER — MENTHOL 3 MG MT LOZG: 1.0000 | LOZENGE | OROMUCOSAL | Status: DC | PRN

## 2021-08-11 MED ORDER — OXYCODONE HCL 5 MG/5ML PO SOLN: 5.0000 mg | Freq: Once | ORAL | Status: DC | PRN

## 2021-08-11 MED ORDER — PRENATAL MULTIVITAMIN CH
1.0000 | ORAL_TABLET | Freq: Every day | ORAL | Status: DC
  Administered 2021-08-12 – 2021-08-13 (×2): 1 via ORAL
  Filled 2021-08-11 (×2): qty 1

## 2021-08-11 MED ORDER — TETANUS-DIPHTH-ACELL PERTUSSIS 5-2.5-18.5 LF-MCG/0.5 IM SUSY: 0.5000 mL | PREFILLED_SYRINGE | Freq: Once | INTRAMUSCULAR | Status: DC

## 2021-08-11 MED ORDER — MEASLES, MUMPS & RUBELLA VAC IJ SOLR: 0.5000 mL | Freq: Once | INTRAMUSCULAR | Status: DC

## 2021-08-11 MED ORDER — ACETAMINOPHEN 500 MG PO TABS
1000.0000 mg | ORAL_TABLET | Freq: Four times a day (QID) | ORAL | Status: DC
  Administered 2021-08-11 – 2021-08-13 (×8): 1000 mg via ORAL
  Filled 2021-08-11 (×8): qty 2

## 2021-08-11 MED ORDER — FERROUS SULFATE 325 (65 FE) MG PO TABS
325.0000 mg | ORAL_TABLET | ORAL | Status: DC
  Administered 2021-08-12: 325 mg via ORAL
  Filled 2021-08-11: qty 1

## 2021-08-11 MED ORDER — KETOROLAC TROMETHAMINE 30 MG/ML IJ SOLN: 30.0000 mg | Freq: Four times a day (QID) | INTRAMUSCULAR | Status: AC | PRN

## 2021-08-11 MED ORDER — ZOLPIDEM TARTRATE 5 MG PO TABS: 5.0000 mg | ORAL_TABLET | Freq: Every evening | ORAL | Status: DC | PRN

## 2021-08-11 MED ORDER — PHENYLEPHRINE HCL-NACL 20-0.9 MG/250ML-% IV SOLN
INTRAVENOUS | Status: AC
Start: 2021-08-11 — End: ?
  Filled 2021-08-11: qty 250

## 2021-08-11 MED ORDER — ONDANSETRON HCL 4 MG/2ML IJ SOLN
INTRAMUSCULAR | Status: DC | PRN
  Administered 2021-08-11: 4 mg via INTRAVENOUS

## 2021-08-11 MED ORDER — MEPERIDINE HCL 25 MG/ML IJ SOLN: 6.2500 mg | INTRAMUSCULAR | Status: DC | PRN

## 2021-08-11 MED ORDER — SOD CITRATE-CITRIC ACID 500-334 MG/5ML PO SOLN
ORAL | Status: AC
Start: 2021-08-11 — End: ?
  Filled 2021-08-11: qty 30

## 2021-08-11 MED ORDER — FENTANYL CITRATE (PF) 100 MCG/2ML IJ SOLN
INTRAMUSCULAR | Status: AC
  Filled 2021-08-11: qty 2

## 2021-08-11 MED ORDER — KETOROLAC TROMETHAMINE 30 MG/ML IJ SOLN
30.0000 mg | Freq: Four times a day (QID) | INTRAMUSCULAR | Status: AC | PRN
  Administered 2021-08-11: 30 mg via INTRAVENOUS

## 2021-08-11 MED ORDER — GABAPENTIN 300 MG PO CAPS
ORAL_CAPSULE | ORAL | Status: AC
  Filled 2021-08-11: qty 1

## 2021-08-11 MED ORDER — DIPHENHYDRAMINE HCL 25 MG PO CAPS: 25.0000 mg | ORAL_CAPSULE | ORAL | Status: DC | PRN

## 2021-08-11 MED ORDER — OXYCODONE HCL 5 MG PO TABS: 5.0000 mg | ORAL_TABLET | Freq: Once | ORAL | Status: DC | PRN

## 2021-08-11 MED ORDER — COCONUT OIL OIL: 1.0000 "application " | TOPICAL_OIL | Status: DC | PRN

## 2021-08-11 MED ORDER — PHENYLEPHRINE 40 MCG/ML (10ML) SYRINGE FOR IV PUSH (FOR BLOOD PRESSURE SUPPORT)
PREFILLED_SYRINGE | INTRAVENOUS | Status: AC
  Filled 2021-08-11: qty 10

## 2021-08-11 MED ORDER — TRANEXAMIC ACID-NACL 1000-0.7 MG/100ML-% IV SOLN
INTRAVENOUS | Status: DC | PRN
  Administered 2021-08-11: 1000 mg via INTRAVENOUS

## 2021-08-11 MED ORDER — DIBUCAINE (PERIANAL) 1 % EX OINT: 1.0000 "application " | TOPICAL_OINTMENT | CUTANEOUS | Status: DC | PRN

## 2021-08-11 MED ORDER — SODIUM CHLORIDE 0.9 % IV SOLN
INTRAVENOUS | Status: AC
  Filled 2021-08-11: qty 2

## 2021-08-11 MED ORDER — SODIUM CHLORIDE 0.9% FLUSH: 3.0000 mL | INTRAVENOUS | Status: DC | PRN

## 2021-08-11 MED ORDER — TRANEXAMIC ACID-NACL 1000-0.7 MG/100ML-% IV SOLN
INTRAVENOUS | Status: AC
  Filled 2021-08-11: qty 100

## 2021-08-11 MED ORDER — ENOXAPARIN SODIUM 60 MG/0.6ML IJ SOSY
60.0000 mg | PREFILLED_SYRINGE | INTRAMUSCULAR | Status: DC
  Administered 2021-08-12 – 2021-08-13 (×2): 60 mg via SUBCUTANEOUS
  Filled 2021-08-11 (×2): qty 0.6

## 2021-08-11 MED ORDER — OXYCODONE HCL 5 MG PO TABS
5.0000 mg | ORAL_TABLET | ORAL | Status: DC | PRN
  Administered 2021-08-12: 5 mg via ORAL
  Filled 2021-08-11: qty 1

## 2021-08-11 MED ORDER — OXYTOCIN-SODIUM CHLORIDE 30-0.9 UT/500ML-% IV SOLN: 2.5000 [IU]/h | INTRAVENOUS | Status: AC

## 2021-08-11 MED ORDER — DIPHENHYDRAMINE HCL 25 MG PO CAPS: 25.0000 mg | ORAL_CAPSULE | Freq: Four times a day (QID) | ORAL | Status: DC | PRN

## 2021-08-11 MED ORDER — SCOPOLAMINE 1 MG/3DAYS TD PT72
1.0000 | MEDICATED_PATCH | TRANSDERMAL | Status: DC
  Administered 2021-08-11: 1.5 mg via TRANSDERMAL

## 2021-08-11 MED ORDER — SOD CITRATE-CITRIC ACID 500-334 MG/5ML PO SOLN
30.0000 mL | ORAL | Status: AC
  Administered 2021-08-11: 30 mL via ORAL

## 2021-08-11 MED ORDER — ONDANSETRON HCL 4 MG/2ML IJ SOLN: 4.0000 mg | Freq: Once | INTRAMUSCULAR | Status: DC | PRN

## 2021-08-11 MED ORDER — FENTANYL CITRATE (PF) 100 MCG/2ML IJ SOLN
INTRAMUSCULAR | Status: DC | PRN
Start: 2021-08-11 — End: 2021-08-11
  Administered 2021-08-11: 15 ug via INTRATHECAL

## 2021-08-11 MED ORDER — DEXAMETHASONE SODIUM PHOSPHATE 10 MG/ML IJ SOLN
INTRAMUSCULAR | Status: AC
Start: 2021-08-11 — End: ?
  Filled 2021-08-11: qty 1

## 2021-08-11 MED ORDER — SIMETHICONE 80 MG PO CHEW: 80.0000 mg | CHEWABLE_TABLET | ORAL | Status: DC | PRN

## 2021-08-11 MED ORDER — ONDANSETRON HCL 4 MG/2ML IJ SOLN: 4.0000 mg | Freq: Three times a day (TID) | INTRAMUSCULAR | Status: DC | PRN

## 2021-08-11 SURGICAL SUPPLY — 35 items
CHLORAPREP W/TINT 26ML (MISCELLANEOUS) ×4 IMPLANT
CLAMP CORD UMBIL (MISCELLANEOUS) ×1 IMPLANT
CLIP FILSHIE TUBAL LIGA STRL (Clip) IMPLANT
CLOTH BEACON ORANGE TIMEOUT ST (SAFETY) ×3 IMPLANT
DRSG OPSITE POSTOP 4X10 (GAUZE/BANDAGES/DRESSINGS) ×3 IMPLANT
ELECT REM PT RETURN 9FT ADLT (ELECTROSURGICAL) ×2
ELECTRODE REM PT RTRN 9FT ADLT (ELECTROSURGICAL) ×2 IMPLANT
EXTRACTOR VACUUM M CUP 4 TUBE (SUCTIONS) IMPLANT
GAUZE SPONGE 4X4 12PLY STRL LF (GAUZE/BANDAGES/DRESSINGS) ×2 IMPLANT
GLOVE BIOGEL PI IND STRL 7.0 (GLOVE) ×6 IMPLANT
GLOVE BIOGEL PI INDICATOR 7.0 (GLOVE) ×3
GLOVE ECLIPSE 7.0 STRL STRAW (GLOVE) ×3 IMPLANT
GOWN STRL REUS W/TWL LRG LVL3 (GOWN DISPOSABLE) ×6 IMPLANT
KIT ABG SYR 3ML LUER SLIP (SYRINGE) IMPLANT
NDL HYPO 25X5/8 SAFETYGLIDE (NEEDLE) ×2 IMPLANT
NEEDLE HYPO 22GX1.5 SAFETY (NEEDLE) ×3 IMPLANT
NEEDLE HYPO 25X5/8 SAFETYGLIDE (NEEDLE) ×2 IMPLANT
NS IRRIG 1000ML POUR BTL (IV SOLUTION) ×3 IMPLANT
PACK C SECTION WH (CUSTOM PROCEDURE TRAY) ×3 IMPLANT
PAD ABD 7.5X8 STRL (GAUZE/BANDAGES/DRESSINGS) ×4 IMPLANT
PAD OB MATERNITY 4.3X12.25 (PERSONAL CARE ITEMS) ×3 IMPLANT
RETRACTOR TRAXI PANNICULUS (MISCELLANEOUS) IMPLANT
RTRCTR C-SECT PINK 25CM LRG (MISCELLANEOUS) IMPLANT
SUT PDS AB 0 CTX 36 PDP370T (SUTURE) IMPLANT
SUT PLAIN 2 0 XLH (SUTURE) ×1 IMPLANT
SUT VIC AB 0 CTX 36 (SUTURE) ×4
SUT VIC AB 0 CTX36XBRD ANBCTRL (SUTURE) ×4 IMPLANT
SUT VIC AB 2-0 CT1 27 (SUTURE) ×2
SUT VIC AB 2-0 CT1 TAPERPNT 27 (SUTURE) IMPLANT
SUT VIC AB 4-0 KS 27 (SUTURE) ×3 IMPLANT
SYR CONTROL 10ML LL (SYRINGE) ×3 IMPLANT
TOWEL OR 17X24 6PK STRL BLUE (TOWEL DISPOSABLE) ×3 IMPLANT
TRAXI PANNICULUS RETRACTOR (MISCELLANEOUS) ×1
TRAY FOLEY W/BAG SLVR 14FR LF (SET/KITS/TRAYS/PACK) ×3 IMPLANT
WATER STERILE IRR 1000ML POUR (IV SOLUTION) ×3 IMPLANT

## 2021-08-11 NOTE — Lactation Note (Signed)
This note was copied from a baby's chart. Lactation Consultation Note  Patient Name: Girl Caroll Primmer [id number] Date: 08/11/2021 Reason for consult: Initial assessment;1st time breastfeeding;Term Age:31 hours  LC in to visit with P2 Mom of term baby.  Baby was delivered by C/S and was sleepy in PACU, Mom did STS with baby, but didn't latch baby.  Baby resting in football hold (RN assisted earlier) STS, subtle feeding cues noted.  Offered to assist with a latch and Mom interested.  Mom has large, heavy breasts with erect nipples and compressible areola.  Reviewed breast massage and hand expression, drop of colostrum expressed.  Assisted Mom to support baby's head from ear to ear and latch baby quickly to breast once her mouth was widely open.  Baby able to suck with deep jaw extensions in a relaxed and consistent manor.  Mom feeling a tug.  Basics reviewed as baby was suckling.   Encouraged continued STS.  Encouraged offering the breast to baby with feeding cues.  Reassured Mom that baby may be tired in the first day.  Encouraged Mom to ask for help with positioning and latching.  Maternal Data Has patient been taught Hand Expression?: Yes Does the patient have breastfeeding experience prior to this delivery?: No  Feeding Mother's Current Feeding Choice: Breast Milk  LATCH Score Latch: Grasps breast easily, tongue down, lips flanged, rhythmical sucking.  Audible Swallowing: Spontaneous and intermittent  Type of Nipple: Everted at rest and after stimulation  Comfort (Breast/Nipple): Soft / non-tender  Hold (Positioning): Assistance needed to correctly position infant at breast and maintain latch.  LATCH Score: 9  Interventions Interventions: Breast feeding basics reviewed;Assisted with latch;Skin to skin;Breast massage;Hand express;Adjust position;Support pillows;Position options;LC Services brochure Consult Status Consult Status: Follow-up Date: 08/12/21 Follow-up type:  In-patient    Broadus John 08/11/2021, 1:08 PM

## 2021-08-11 NOTE — Anesthesia Preprocedure Evaluation (Addendum)
Anesthesia Evaluation  Patient identified by MRN, date of birth, ID band Patient awake    Reviewed: Allergy & Precautions, NPO status , Patient's Chart, lab work & pertinent test results, reviewed documented beta blocker date and time   Airway Mallampati: II  TM Distance: >3 FB Neck ROM: Full    Dental no notable dental hx. (+) Teeth Intact, Dental Advisory Given   Pulmonary former smoker,  Cystic fibrosis carrier   Pulmonary exam normal breath sounds clear to auscultation       Cardiovascular hypertension, Normal cardiovascular exam Rhythm:Regular Rate:Normal     Neuro/Psych PSYCHIATRIC DISORDERS Anxiety Depression    GI/Hepatic Neg liver ROS, GERD  Medicated,  Endo/Other  diabetes, GestationalMorbid obesityHx/o GDM with prior pregnancy  Renal/GU negative Renal ROS  negative genitourinary   Musculoskeletal negative musculoskeletal ROS (+)   Abdominal (+) + obese,   Peds  Hematology negative hematology ROS (+)   Anesthesia Other Findings   Reproductive/Obstetrics (+) Pregnancy Hx/o Previous C/Section                            Anesthesia Physical Anesthesia Plan  ASA: 3  Anesthesia Plan: Spinal   Post-op Pain Management:    Induction:   PONV Risk Score and Plan: 4 or greater and Treatment may vary due to age or medical condition and Scopolamine patch - Pre-op  Airway Management Planned: Natural Airway  Additional Equipment:   Intra-op Plan:   Post-operative Plan:   Informed Consent: I have reviewed the patients History and Physical, chart, labs and discussed the procedure including the risks, benefits and alternatives for the proposed anesthesia with the patient or authorized representative who has indicated his/her understanding and acceptance.     Dental advisory given  Plan Discussed with: CRNA and Anesthesiologist  Anesthesia Plan Comments:        Anesthesia  Quick Evaluation

## 2021-08-11 NOTE — Anesthesia Postprocedure Evaluation (Signed)
Anesthesia Post Note  Patient: Brooke Robertson  Procedure(s) Performed: CESAREAN SECTION     Patient location during evaluation: PACU Anesthesia Type: Spinal Level of consciousness: oriented and awake and alert Pain management: pain level controlled Vital Signs Assessment: post-procedure vital signs reviewed and stable Respiratory status: spontaneous breathing, respiratory function stable and nonlabored ventilation Cardiovascular status: blood pressure returned to baseline and stable Postop Assessment: no headache, no backache, no apparent nausea or vomiting, spinal receding and patient able to bend at knees Anesthetic complications: no   No notable events documented.  Last Vitals:  Vitals:   08/11/21 1145 08/11/21 1200  BP: 92/60 108/69  Pulse: 66 70  Resp: 20 19  Temp:  36.4 C  SpO2: 100% 99%    Last Pain:  Vitals:   08/11/21 1200  TempSrc: Axillary  PainSc: 0-No pain   Pain Goal:                Epidural/Spinal Function Cutaneous sensation: Tingles (08/11/21 1200), Patient able to flex knees: Yes (08/11/21 1200), Patient able to lift hips off bed: Yes (08/11/21 1200), Back pain beyond tenderness at insertion site: No (08/11/21 1200), Progressively worsening motor and/or sensory loss: No (08/11/21 1200), Bowel and/or bladder incontinence post epidural: No (08/11/21 1200)  Topaz Raglin A.

## 2021-08-11 NOTE — Lactation Note (Signed)
This note was copied from a baby's chart. Lactation Consultation Note  Patient Name: Girl Aleysa Mayse [id number] Date: 08/11/2021 Reason for consult: Follow-up assessment;Term (RN request Eugenio Saenz) Age:31 hours P2, term female infant with C/S delivery. Mom's current feeding choice is breast and donor breast milk. LC unable assist with latch, per mom, infant was given 10 mls of donor breast milk and then she attempt to latch infant, infant breastfeed for 1 minute. LC did not observed  latch, infant asleep at this time. LC discussed with mom to latch infant first for every feeding before supplementing infant with donor breast milk. Mom understands if she need assistance with latching infant at the breast to call RN/LC.  Mom was set up with DEBP, mom was using DEBP, when LC was in the room, mom was expressing colostrum in breast flange, mom will pump every 3 hours for 15 minutes and give infant back any EBM first before offering infant donor breast milk. Mom knows to breastfeed infant according to hunger cues, 8 to 12+ or more times within 24 hours, skin to skin. Mom's plan: 1- Mom will latch infant according to hunger cues,skin to skin and ask RN/LC for latch assistance if needed, Mom will latch infant before supplementing infant with EBM/and or donor breast milk. 2- Mom her choice  to supplement with donor breast milk  on day 1, mom  knows to offer (5-7 mls of EBM/Donor  milk) after latching infant at the breast. 3- Mom will continue to use DEBP every 3 hours for 15 minutes on initial setting, mom will give infant back any EBM first before supplementing with donor breast milk. Maternal Data Has patient been taught Hand Expression?: Yes Does the patient have breastfeeding experience prior to this delivery?: Yes How long did the patient breastfeed?: Per mom, she briefly BF her 1st child, infant had latch difficultes, she is currently 31 years old.  Feeding Mother's Current Feeding Choice: Breast  Milk and Donor Milk Nipple Type: Slow - flow  LATCH Score                    Lactation Tools Discussed/Used Tools: Pump Breast pump type: Double-Electric Breast Pump Pump Education: Setup, frequency, and cleaning;Milk Storage Reason for Pumping: Help establish milk supply, mom is giving infant donor breast milk, C/S delivery. Pumping frequency: Mom knows to pump every 3 hours for 15 minutes on inital setting. Pumped volume: 1 mL  Interventions Interventions: Skin to skin;Expressed milk;Hand pump;DEBP;Education  Discharge    Consult Status Consult Status: Follow-up Date: 08/12/21 Follow-up type: In-patient    Vicente Serene 08/11/2021, 7:08 PM

## 2021-08-11 NOTE — Anesthesia Procedure Notes (Signed)
Spinal  Patient location during procedure: OR Start time: 08/11/2021 9:27 AM End time: 08/11/2021 9:31 AM Reason for block: surgical anesthesia Staffing Performed: anesthesiologist  Anesthesiologist: Josephine Igo, MD Preanesthetic Checklist Completed: patient identified, IV checked, site marked, risks and benefits discussed, surgical consent, monitors and equipment checked, pre-op evaluation and timeout performed Spinal Block Patient position: sitting Prep: DuraPrep and site prepped and draped Patient monitoring: heart rate, cardiac monitor, continuous pulse ox and blood pressure Approach: midline Location: L3-4 Injection technique: single-shot Needle Needle type: Pencan  Needle gauge: 24 G Needle length: 9 cm Needle insertion depth: 8 cm Assessment Sensory level: T4 Events: CSF return Additional Notes Patient tolerated procedure well. Adequate sensory level.

## 2021-08-11 NOTE — Discharge Summary (Signed)
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 yo G3P1011 at 31w0das admitted to the hospital 08/11/2021 for scheduled cesarean section with the following indication: Elective Repeat. Delivery details are as follows:  Membrane Rupture Time/Date: 9:59 AM ,08/11/2021   Delivery Method:C-Section, Low Transverse  Details of operation can be found in separate operative note.  Patient had an uncomplicated postpartum course.  Her hemoglobin on POD#1 was 11.3, down from 13.7, for which she received oral iron.  She will continue this upon discharge.  She also had a Nexplanon placed for birth control postpartum.  She is ambulating, tolerating a regular diet, passing flatus, and urinating well. Patient is discharged home in stable condition on  08/13/21        Newborn Data: Birth date:08/11/2021  Birth time:9:59 AM  Gender:Female  Living status:Living  Apgars:8 ,9  Weight:3780 g     Magnesium Sulfate received: No BMZ received: No Rhophylac: N/A MMR: N/A T-DaP: Given prenatally Flu: No Transfusion: No    Physical exam  Vitals:   08/12/21 0815 08/12/21  1425 08/12/21 2219 08/13/21 0557  BP: 104/64 (!) 117/53 123/68 111/70  Pulse: 64 61 67 69  Resp: '18 17 18 18  ' Temp: 98 F (36.7 C) 97.9 F (36.6 C) 98.4 F (36.9 C) 98 F (36.7 C)  TempSrc: Oral Oral Oral Oral  SpO2: 98% 98% 100% 100%  Weight:      Height:       General: alert, cooperative, and no distress Lochia: appropriate Uterine Fundus: firm and below umbilicus  Incision: healing well with minimal drainage, no significant erythema, dressing is clean, dry, and intact  DVT Evaluation: trace LE edema, no calf tenderness to palpation   Labs: Lab Results  Component Value Date   WBC 15.1 (H) 08/12/2021   HGB 11.3 (L) 08/12/2021   HCT 31.8 (L) 08/12/2021   MCV 91.4 08/12/2021   PLT 253 08/12/2021   CMP Latest Ref Rng & Units 08/12/2021  Glucose 70 - 99 mg/dL -  BUN 6 - 20 mg/dL -  Creatinine 0.44 - 1.00 mg/dL 0.63  Sodium 135 - 145 mmol/L -  Potassium 3.5 - 5.1 mmol/L -  Chloride 98 - 111 mmol/L -  CO2 22 - 32 mmol/L -  Calcium 8.9 - 10.3 mg/dL -  Total Protein 6.0 - 8.5 g/dL -  Total Bilirubin 0.0 - 1.2 mg/dL -  Alkaline Phos 44 - 121 IU/L -  AST 0 - 40 IU/L -  ALT 0 -  32 IU/L -   Edinburgh Score: Edinburgh Postnatal Depression Scale Screening Tool 08/12/2021  I have been able to laugh and see the funny side of things. 0  I have looked forward with enjoyment to things. 0  I have blamed myself unnecessarily when things went wrong. 0  I have been anxious or worried for no good reason. 0  I have felt scared or panicky for no good reason. 0  Things have been getting on top of me. 0  I have been so unhappy that I have had difficulty sleeping. 0  I have felt sad or miserable. 0  I have been so unhappy that I have been crying. 0  The thought of harming myself has occurred to me. 0  Edinburgh Postnatal Depression Scale Total 0     After visit meds:  Allergies as of 08/13/2021   No Known Allergies      Medication List     STOP taking these medications     aspirin EC 81 MG tablet       TAKE these medications    acetaminophen 500 MG tablet Commonly known as: TYLENOL Take 2 tablets (1,000 mg total) by mouth every 8 (eight) hours as needed (pain).   ibuprofen 600 MG tablet Commonly known as: ADVIL Take 1 tablet (600 mg total) by mouth every 6 (six) hours as needed (pain).   oxyCODONE 5 MG immediate release tablet Commonly known as: Oxy IR/ROXICODONE Take 1 tablet (5 mg total) by mouth every 6 (six) hours as needed for severe pain or breakthrough pain.   Prenatal Vitamin 27-0.8 MG Tabs Take 1 tablet by mouth daily. What changed: Another medication with the same name was removed. Continue taking this medication, and follow the directions you see here.               Discharge Care Instructions  (From admission, onward)           Start     Ordered   08/13/21 0000  Discharge wound care:       Comments: Remove dressing 5 days after your surgery date. You can then wash gently with soap and water and pat dry. You will have an incision check in about one week in the office.   08/13/21 1057             Discharge home in stable condition Infant Feeding: Breast Infant Disposition: home with mother Discharge instruction: per After Visit Summary and Postpartum booklet. Activity: Advance as tolerated. Pelvic rest for 6 weeks.  Diet: routine diet Future Appointments: Future Appointments  Date Time Provider [place]  08/20/2021 10:00 AM [facility]  09/24/2021  2:15 PM Danielle Rankin [facility]   Follow up Visit: Message sent to [place] by Dr. Gwenlyn Perking on 08/11/21.  Please schedule this patient for a In person postpartum visit in 6 weeks with the following provider: Any provider. Additional Postpartum F/U: Incision check 1 week  High risk pregnancy complicated by:  Hx of CS Delivery mode:  C-Section, Low Transverse  Anticipated Birth Control:  Nexplanon placed   08/13/2021 Genia Del, MD

## 2021-08-11 NOTE — Op Note (Signed)
Brooke Robertson PROCEDURE DATE: 08/11/2021  PREOPERATIVE DIAGNOSES: Intrauterine pregnancy at [redacted]w[redacted]d weeks gestation; patient declines vaginal delivery attempt and previous cesarean section  POSTOPERATIVE DIAGNOSES: The same  PROCEDURE: Low Transverse Cesarean Section  SURGEON:  Dr. Jaynie Collins  ASSISTANT:  Dr. Evalina Field  ANESTHESIOLOGY TEAM: Anesthesiologist: Mal Amabile, MD CRNA: Elgie Congo, CRNA  INDICATIONS: Brooke Robertson is a 31 y.o. [phone] at [redacted]w[redacted]d here for cesarean section secondary to the indications listed under preoperative diagnoses; please see preoperative note for further details.  The risks of surgery were discussed with the patient including but were not limited to: bleeding which may require transfusion or reoperation; infection which may require antibiotics; injury to bowel, bladder, ureters or other surrounding organs; injury to the fetus; need for additional procedures including hysterectomy in the event of a life-threatening hemorrhage; formation of adhesions; placental abnormalities wth subsequent pregnancies; incisional problems; thromboembolic phenomenon and other postoperative/anesthesia complications.  The patient concurred with the proposed plan, giving informed written consent for the procedure.    FINDINGS:  Viable female infant in cephalic presentation.  Apgars 8 and 9.  Loose nuchal cord x 2.  Clear amniotic fluid.  Intact placenta, three vessel cord.  Normal uterus, fallopian tubes and ovaries bilaterally. Minimal adhesive disease noted, just involving vesicouterine peritoneum being very high up on the uterus.  ANESTHESIA: Spinal INTRAVENOUS FLUIDS: 2500 ml   ESTIMATED BLOOD LOSS: 500 ml URINE OUTPUT:  100 ml SPECIMENS: Placenta sent to L&D COMPLICATIONS: None immediate  PROCEDURE IN DETAIL:  The patient preoperatively received intravenous antibiotics and had sequential compression devices applied to her lower extremities.  She was then  taken to the operating room where spinal anesthesia was administered and was found to be adequate. She was then placed in a dorsal supine position with a leftward tilt, and prepped and draped in a sterile manner.  A foley catheter was placed into her bladder and attached to constant gravity.  After an adequate timeout was performed, a Pfannenstiel skin incision was made with scalpel and carried through to the underlying layer of fascia. The fascia was incised in the midline, and this incision was extended bluntly.  The underlying rectus muscles were dissected off bluntly superiorly and inferiorly. The rectus muscles were separated in the midline and the peritoneum was entered bluntly. The Alexis self-retaining retractor was introduced into the abdominal cavity.  Attention was turned to the lower uterine segment where a bladder flap was created due to the high vesicouterine peritoneum.  A low transverse hysterotomy was made with a scalpel and extended bilaterally bluntly.  The infant was successfully delivered, the cord was clamped and cut after one minute, and the infant was handed over to the awaiting neonatology team. Uterine massage was then administered, and the placenta delivered intact with a three-vessel cord. The uterus was then cleared of clots and debris.  The hysterotomy was closed with 0 Vicryl in a running locked fashion, and an imbricating layer was also placed with 0 Vicryl.  The pelvis was cleared of all clot and debris. Hemostasis was confirmed on all surfaces.  The retractor was removed.  The peritoneum was closed with a 0 Vicryl running stitch. The fascia was then closed using 0 PDS in a running fashion.  The subcutaneous layer was irrigated, reapproximated with 2-0 plain gut interrupted stitches, and the skin was closed with a 4-0 Vicryl subcuticular stitch. The patient tolerated the procedure well. Sponge, instrument and needle counts were correct x 3.  She was  taken to the recovery room in  stable condition.    Jaynie Collins, MD, FACOG Obstetrician & Gynecologist, [facility] for [employer], [facility]

## 2021-08-11 NOTE — Transfer of Care (Signed)
Immediate Anesthesia Transfer of Care Note  Patient: Brooke Robertson  Procedure(s) Performed: CESAREAN SECTION  Patient Location: PACU  Anesthesia Type:Spinal  Level of Consciousness: awake, alert  and oriented  Airway & Oxygen Therapy: Patient Spontanous Breathing  Post-op Assessment: Report given to RN and Post -op Vital signs reviewed and stable  Post vital signs: Reviewed and stable  Last Vitals:  Vitals Value Taken Time  BP 98/57 08/11/21 1100  Temp    Pulse 68 08/11/21 1102  Resp 18 08/11/21 1102  SpO2 100 % 08/11/21 1102  Vitals shown include unvalidated device data.  Last Pain:  Vitals:   08/11/21 0740  TempSrc: Oral  PainSc: 0-No pain         Complications: No notable events documented.

## 2021-08-12 ENCOUNTER — Encounter (HOSPITAL_COMMUNITY): Payer: Self-pay | Admitting: Obstetrics & Gynecology

## 2021-08-12 ENCOUNTER — Other Ambulatory Visit (HOSPITAL_COMMUNITY): Payer: Self-pay

## 2021-08-12 ENCOUNTER — Encounter: Payer: Medicaid Other | Admitting: Obstetrics and Gynecology

## 2021-08-12 DIAGNOSIS — Z30017 Encounter for initial prescription of implantable subdermal contraceptive: Secondary | ICD-10-CM | POA: Diagnosis not present

## 2021-08-12 LAB — CBC
HCT: 31.8 % — ABNORMAL LOW (ref 36.0–46.0)
Hemoglobin: 11.3 g/dL — ABNORMAL LOW (ref 12.0–15.0)
MCH: 32.5 pg (ref 26.0–34.0)
MCHC: 35.5 g/dL (ref 30.0–36.0)
MCV: 91.4 fL (ref 80.0–100.0)
Platelets: 253 10*3/uL (ref 150–400)
RBC: 3.48 MIL/uL — ABNORMAL LOW (ref 3.87–5.11)
RDW: 12.6 % (ref 11.5–15.5)
WBC: 15.1 10*3/uL — ABNORMAL HIGH (ref 4.0–10.5)
nRBC: 0 % (ref 0.0–0.2)

## 2021-08-12 LAB — CREATININE, SERUM
Creatinine, Ser: 0.63 mg/dL (ref 0.44–1.00)
GFR, Estimated: >60 mL/min (ref >60)

## 2021-08-12 MED ORDER — LIDOCAINE HCL 1 % IJ SOLN
0.0000 mL | Freq: Once | INTRAMUSCULAR | Status: AC | PRN
  Administered 2021-08-12: 1 mL via INTRADERMAL
  Filled 2021-08-12: qty 20

## 2021-08-12 MED ORDER — ETONOGESTREL 68 MG ~~LOC~~ IMPL
68.0000 mg | DRUG_IMPLANT | Freq: Once | SUBCUTANEOUS | Status: AC
Start: 2021-08-12 — End: 2021-08-12
  Administered 2021-08-12: 68 mg via SUBCUTANEOUS
  Filled 2021-08-12: qty 1

## 2021-08-12 NOTE — Progress Notes (Signed)
Called by RN that dressing had curled off of the nexplanon insertion site with some saturation of the steri strips.   Evaluated at bedside. The coban had curled to her elbow with the steri strips falling off. Insertion site appears dry without any bleeding. Nexplanon palpated easily under the skin. No erythema, pus like drainage or warmth to touch.   Removed steri strips/coban. Placed a folded guaze over the site with tape. Can take off tomorrow evening.    Allayne Stack, DO

## 2021-08-12 NOTE — Procedures (Signed)
GYNECOLOGY PROCEDURE NOTE  Brooke Robertson is a 31 y.o. [id number] requesting Nexplanon insertion. No gynecologic concerns.  Nexplanon Insertion Procedure Patient identified, informed consent performed, consent signed. Patient does understand that irregular bleeding is a very common side effect of this medication. She was advised to have backup contraception for one week after placement. Appropriate time out taken. Patient's left arm was prepped and draped in the usual sterile fashion. The insertion area was measured and marked. Patient was prepped with alcohol swab and then injected with 3 ml of 1% lidocaine. The area was then prepped with betadine. Nexplanon removed from packaging and device confirmed present within needle, then inserted full length of needle and withdrawn per handbook instructions. Nexplanon was able to palpated in the patient's arm; patient palpated the insert herself. There was minimal blood loss. Patient insertion site covered with steri strip, guaze, and a pressure bandage to reduce any bruising. The patient tolerated the procedure well and was given post procedure instructions.

## 2021-08-12 NOTE — H&P (Signed)
CSW received consult for hx Anxiety, Depression and needs help with obtaining medications both financially and due to lack of transportation/support CSW met with [name] to offer support and complete assessment.   ° °CSW met with [name] at bedside and introduced CSW role. CSW observed [name] up in the room and the infant asleep in the bassinet. [name] presented calm and welcomed CSW visit. CSW inquired how [name] has felt since giving birth. [name] reported feeling "fine" and shared the labor and delivery went "pretty fast." CSW inquired how [name] felt emotionally throughout the pregnancy. [name] reported feeling hormonal with ups and downs but no concerns with depression or anxiety. [name] reported she was diagnosed with depression and anxiety in 2016. [name] reported in 2016 her older daughter was admitted to the NICU for two months and she did not have family support because she lived in [place] at the time. 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 [name] reported no therapy or medication treatment. CSW provided education regarding the baby blues period vs. perinatal mood disorders, discussed treatment and gave resources for mental health follow up if concerns arise.  CSW recommended [name] complete a self-evaluation during the postpartum time period using the New Mom Checklist from Postpartum Progress and encouraged [name] to contact a  °medical professional if symptoms are noted at any time. [name] denied thoughts of harm to self and others. CSW inquired about [name] supports. [name] identified FOB and her parents as supports.  ° °[name] reported that she has essential items for the infant including a bassinet however she can use assistance with additional diapers and wipes. CSW educated [name] about [facility]. [name] gave CSW permission to make a referral. CSW provided review of Sudden Infant Death Syndrome (SIDS) precautions. CSW inquired about [name]  transportation needs. [name] reported that her father wrecked his vehicle over the weekend so she and the infant will need transportation home. CSW informed [name] that the TOC can assist with transportation need at discharge. CSW inquired about transportation to future appointments. [name] has chosen [facility] for the infant's follow up care and reported that she will have transportation to the first appointment. CSW provided education about Medicaid transportation and provided [name] with resources. [name] reported understanding.  ° °[name] reported she would like to get discharge medications through the "[facility]" prior to discharge. [name] reported that she tried to sign up for services online but is having issues. [name] reported that she funds to cover the co pay. CSW offered to assist. CSW reached out to the TOC Case Manager and informed her about [name] request. The Case Manager has made the TOC pharmacy aware so that [name] can receive medications prior to discharge.  ° °CSW available as needed for discharge plans.  ° °CSW identifies no further need for intervention and no barriers to discharge at this time.  ° °Brooke Robertson, MSW, LCSW °[facility]  °Clinical Social Worker  °[phone] °08/12/2021  11:04 AM  °

## 2021-08-12 NOTE — Progress Notes (Signed)
POSTPARTUM PROGRESS NOTE  POD #1  Subjective:  Brooke Robertson is a 31 y.o. G3P1011 s/p rLTCS at [redacted]w[redacted]d. No acute events overnight. She reports she is doing well. She denies any problems with ambulating, voiding or po intake. Denies nausea or vomiting. She has passed flatus. Pain is well controlled.  Lochia is mild.  Objective: Blood pressure (!) 101/49, pulse 61, temperature 98.1 F (36.7 C), temperature source Oral, resp. rate 20, height 5' (1.524 m), weight 108.9 kg, last menstrual period 12/11/2020, SpO2 100 %.  Physical Exam:  General: alert, cooperative and no distress Chest: no respiratory distress Heart: regular rate, distal pulses intact Uterine Fundus: firm, appropriately tender DVT Evaluation: No calf swelling or tenderness Extremities: minimal edema Skin: warm, dry; pressure dressing and honeycomb dressing in place, appears to have some saturation underneath but unable to visualize all   Recent Labs    08/09/21 1006 08/12/21 0431  HGB 13.7 11.3*  HCT 39.1 31.8*    Assessment/Plan: Brooke Robertson is a 31 y.o. G3P1011 s/p rLTCS at [redacted]w[redacted]d.  POD#1 - Doing welll; pain well controlled. H/H appropriate  Routine postpartum care  OOB, ambulated  Lovenox for VTE prophylaxis Acute blood loss Anemia: asymptomatic   Contraception: nexplanon, will place prior to discharge  Feeding: breast  Dispo: Plan for discharge tomorrow or the following day.   LOS: 1 day   Leticia Penna, DO OB Fellow  08/12/2021, 7:32 AM

## 2021-08-13 ENCOUNTER — Other Ambulatory Visit (HOSPITAL_COMMUNITY): Payer: Self-pay

## 2021-08-13 LAB — TYPE AND SCREEN
ABO/RH(D): B POS
Antibody Screen: POSITIVE
Donor AG Type: NEGATIVE
Donor AG Type: NEGATIVE
Unit division: 0
Unit division: 0

## 2021-08-13 LAB — BPAM RBC
Blood Product Expiration Date: 202303162359
Blood Product Expiration Date: 202303172359
Unit Type and Rh: 7300
Unit Type and Rh: 7300

## 2021-08-13 MED ORDER — ACETAMINOPHEN 500 MG PO TABS
1000.0000 mg | ORAL_TABLET | Freq: Three times a day (TID) | ORAL | 0 refills | Status: DC | PRN
  Filled 2021-08-13: qty 60, 10d supply, fill #0

## 2021-08-13 MED ORDER — IBUPROFEN 600 MG PO TABS
600.0000 mg | ORAL_TABLET | Freq: Four times a day (QID) | ORAL | 0 refills | Status: DC | PRN
  Filled 2021-08-13: qty 40, 10d supply, fill #0

## 2021-08-13 MED ORDER — OXYCODONE HCL 5 MG PO TABS
5.0000 mg | ORAL_TABLET | Freq: Four times a day (QID) | ORAL | 0 refills | Status: DC | PRN
  Filled 2021-08-13: qty 12, 3d supply, fill #0

## 2021-08-13 NOTE — Lactation Note (Signed)
This note was copied from a baby's chart. Lactation Consultation Note Mom has changed to formula feeding only.  Patient Name: Brooke Robertson [id number] Date: 08/13/2021   Age:31 hours  Maternal Data    Feeding Mother's Current Feeding Choice: Formula Nipple Type: Slow - flow  LATCH Score                    Lactation Tools Discussed/Used    Interventions    Discharge    Consult Status Consult Status: Complete Date: 08/13/21    Charyl Dancer 08/13/2021, 4:12 AM

## 2021-08-13 NOTE — Social Work (Signed)
CSW completed a taxi voucher to transport [name]/FOB and the infant. [name] confirmed they will transport to the address on file.   Nurse confirmed with the [facility] pharmacy medications will be delivered prior to discharge.   No other needs identified.   Kathrin Greathouse, MSW, LCSW [facility] [place] Worker  [phone] 08/13/2021  11:41 AM

## 2021-08-19 ENCOUNTER — Other Ambulatory Visit: Payer: Self-pay

## 2021-08-19 ENCOUNTER — Inpatient Hospital Stay (HOSPITAL_COMMUNITY)
Admission: AD | Admit: 2021-08-19 | Discharge: 2021-08-19 | Disposition: A | Discharge status: Home / Self Care | Payer: Medicaid Other | Attending: Family Medicine | Admitting: Family Medicine

## 2021-08-19 DIAGNOSIS — Z98891 History of uterine scar from previous surgery: Secondary | ICD-10-CM

## 2021-08-19 DIAGNOSIS — O9089 Other complications of the puerperium, not elsewhere classified: Secondary | ICD-10-CM | POA: Diagnosis present

## 2021-08-19 NOTE — MAU Provider Note (Signed)
? ?  S ?Ms. Brooke Robertson is a 31 y.o. [id number] patient who presents to MAU today with complaint of concerns of her wound opening up.  She had a C-section approximately 10 days ago.  Her honeycomb dressing just fell off and she saw some open areas. ? ?O ?BP 120/74 (BP Location: Right Arm)   Pulse 74   Temp 98.5 ?F (36.9 ?C) (Oral)   Resp 18   Ht 5' (1.524 m)   Wt 105.3 kg   SpO2 99%   BMI 45.35 kg/m?  ?Physical Exam ?Vitals reviewed.  ?Constitutional:   ?   Appearance: Normal appearance.  ?Cardiovascular:  ?   Rate and Rhythm: Normal rate and regular rhythm.  ?Pulmonary:  ?   Effort: Pulmonary effort is normal.  ?   Breath sounds: Normal breath sounds.  ?Abdominal:  ?   Comments: Pfannensteil incision healing. Some exposed areas of subcutaneous tissue exposed. No evidence of infection  ?Neurological:  ?   Mental Status: She is alert.  ?Psychiatric:     ?   Mood and Affect: Mood normal.     ?   Behavior: Behavior normal.     ?   Thought Content: Thought content normal.     ?   Judgment: Judgment normal.  ? ? ?A ?Medical screening exam complete ?1. S/P cesarean section   ? ? ? ?P ?Discharge from MAU in stable condition ?No evidence of infection. ?Keep clean and dry. ?Patient may return to MAU as needed  ? ?[name], DO ?08/19/2021 12:28 PM  ? ?

## 2021-08-19 NOTE — MAU Note (Signed)
Brooke Robertson is a 31 y.o. here in MAU reporting: S/P Cesarean 08/11/2021 and needs incision check secondary incision appears to be opening. ? ?Onset of complaint: 08/19/2021 ? ?Pain score:  5 Tightness @ incision site ? ?

## 2021-08-20 ENCOUNTER — Ambulatory Visit: Payer: Medicaid Other

## 2021-08-21 ENCOUNTER — Other Ambulatory Visit: Payer: Self-pay

## 2021-08-21 ENCOUNTER — Ambulatory Visit (INDEPENDENT_AMBULATORY_CARE_PROVIDER_SITE_OTHER): Payer: Medicaid Other | Admitting: General Practice

## 2021-08-21 VITALS — BP 116/73 | HR 67 | Ht 60.0 in | Wt 229.0 lb

## 2021-08-21 DIAGNOSIS — Z5189 Encounter for other specified aftercare: Secondary | ICD-10-CM

## 2021-08-21 NOTE — Progress Notes (Signed)
Patient presents to office today for wound check following c-section on 2/26. Patient reports doing well since delivery. She went to MAU two days ago with concern about incision but was told everything was fine and discharged home. Incision is clean, dry & intact. Several small areas of granulation tissue present. Patient asked about bruising and lumps under her skin around her mid abdomen. Discussed that is likely a reaction from tape used during the c-section and should eventually go away by her postpartum visit. Wound care and signs & symptoms of infection reviewed with patient. Patient will return 4/11 for her pp visit. ? ?Chase Caller RN BSN ?08/21/21 ? ?

## 2021-09-24 ENCOUNTER — Encounter: Payer: Self-pay | Admitting: Medical

## 2021-09-24 ENCOUNTER — Ambulatory Visit (INDEPENDENT_AMBULATORY_CARE_PROVIDER_SITE_OTHER): Payer: Medicaid Other | Admitting: Medical

## 2021-09-24 NOTE — Progress Notes (Signed)
? ? ?Post Partum Visit Note ? ?Brooke Robertson is a 31 y.o. G80P2012 female who presents for a postpartum visit. She is  6.2  weeks postpartum following a repeat cesarean section.  I have fully reviewed the prenatal and intrapartum course. The delivery was at 39 gestational weeks.  Anesthesia: spinal. Postpartum course has been normal. Baby is doing well. Baby is feeding by both breast and bottle - Octavia Heir . Bleeding changing a maxi pad every 2 hours. Bowel function is normal. Bladder function is normal. Patient is not sexually active. Contraception method is Nexplanon. Postpartum depression screening: negative. ? ? Upstream - 09/24/21 1427   ? ?  ? Pregnancy Intention Screening  ? Does the patient want to become pregnant in the next year? No   ? Does the patient's partner want to become pregnant in the next year? Unsure   ? Would the patient like to discuss contraceptive options today? No   ?  ? Contraception Wrap Up  ? Current Method Hormonal Implant   ? End Method Hormonal Implant   ? Contraception Counseling Provided No   ? ?  ?  ? ?  ? ?The pregnancy intention screening data noted above was reviewed. Potential methods of contraception were discussed. The patient elected to proceed with Hormonal Implant. ? ? Edinburgh Postnatal Depression Scale - 09/24/21 1408   ? ?  ? Edinburgh Postnatal Depression Scale:  In the Past 7 Days  ? I have been able to laugh and see the funny side of things. 2   ? I have looked forward with enjoyment to things. 0   ? I have blamed myself unnecessarily when things went wrong. 0   ? I have been anxious or worried for no good reason. 0   ? I have felt scared or panicky for no good reason. 0   ? Things have been getting on top of me. 0   ? I have been so unhappy that I have had difficulty sleeping. 2   ? I have felt sad or miserable. 2   ? I have been so unhappy that I have been crying. 0   ? The thought of harming myself has occurred to me. 0   ? Edinburgh Postnatal Depression Scale  Total 6   ? ?  ?  ? ?  ? ? ?Health Maintenance Due  ?Topic Date Due  ? COVID-19 Vaccine (1) Never done  ? ? ?The following portions of the patient's history were reviewed and updated as appropriate: allergies, current medications, past family history, past medical history, past social history, past surgical history, and problem list. ? ?Review of Systems ?Pertinent items are noted in HPI. ? ?Objective:  ?BP 115/80   Pulse 67   Wt 225 lb (102.1 kg)   Breastfeeding Yes   BMI 43.94 kg/m?   ? ?General:  alert and cooperative  ? Breasts:  not indicated  ?Lungs: clear to auscultation bilaterally  ?Heart:  regular rate and rhythm, S1, S2 normal, no murmur, click, rub or gallop  ?Abdomen: soft, non-tender; bowel sounds normal; no masses,  no organomegaly and      ?Wound well approximated incision  ?GU exam:  not indicated  ?     ?Assessment:  ? ?Normal postpartum exam.  ?S/P RTLCS ? ?Plan:  ? ?Essential components of care per ACOG recommendations: ? ?1.  Mood and well being: Patient with negative depression screening today.  ?- Patient tobacco use? No.   ?- hx  of drug use? No.   ? ?2. Infant care and feeding:  ?-Patient currently breastmilk feeding? Yes. Discussed returning to work and pumping.  ?-Social determinants of health (SDOH) reviewed in EPIC. The following needs were identified- formula & diapers. Patient taken to [employer] today.  ? ?3. Sexuality, contraception and birth spacing ?- Patient does not want a pregnancy in the next year.  Desired family size is 2 children.  ?- Reviewed reproductive life planning. Reviewed contraceptive methods based on pt preferences and effectiveness.  Patient had Hormonal Implant placed PP.   ?- Discussed birth spacing of 18 months ? ?4. Sleep and fatigue ?-Encouraged family/partner/community support of 4 hrs of uninterrupted sleep to help with mood and fatigue ? ?5. Physical Recovery  ?- Discussed patients delivery and complications. She describes her labor as good. ?-  Patient had a C-section repeat; no problems after deliver. ?- Patient has urinary incontinence? No. ?- Patient is not safe to resume physical and sexual activity ? ?6.  Health Maintenance ?- HM due items addressed Yes ?- Last pap smear  ?Diagnosis  ?Date Value Ref Range Status  ?07/18/2020   Final  ? - Negative for intraepithelial lesion or malignancy (NILM)  ? Pap smear not done at today's visit.  ?-Breast Cancer screening indicated? No.  ? ?7. Chronic Disease/Pregnancy Condition follow up: None ? ? ?Vonzella Nipple, PA-C ?Center for [employer], [facility]  ?

## 2021-09-29 ENCOUNTER — Telehealth: Payer: Medicaid Other | Admitting: Emergency Medicine

## 2021-09-29 NOTE — Progress Notes (Signed)
The patient no-showed for appointment despite this provider sending direct link, reaching out via phone with no response and waiting for at least 10 minutes from appointment time for patient to join. They will be marked as a NS for this appointment/time.   Randeep Biondolillo, PA-C    

## 2021-10-16 ENCOUNTER — Telehealth: Payer: Self-pay

## 2021-10-16 DIAGNOSIS — Z975 Presence of (intrauterine) contraceptive device: Secondary | ICD-10-CM

## 2021-10-16 MED ORDER — NORETHINDRONE 0.35 MG PO TABS: 1.0000 | ORAL_TABLET | Freq: Every day | ORAL | 2 refills | Status: DC

## 2021-10-16 NOTE — Telephone Encounter (Signed)
Patient called front office to report breakthrough bleeding on Nexplanon. Pt requests medication to stop bleeding and to speak with nurse. Per chart review Nexplanon placed 08/12/21. Called pt; VM left. ?

## 2021-10-16 NOTE — Addendum Note (Signed)
Addended by: Marjo Bicker on: 10/16/2021 05:31 PM ? ? Modules accepted: Orders ? ?

## 2021-10-16 NOTE — Telephone Encounter (Addendum)
Patient returned phone call. States she has been bleeding since delivery on 08/11/21, has only stopped bleeding for a few hours at a time. Describes bleeding as changing between bright red blood and lighter spotting. States she was told at postpartum that there was medication she could take if the bleeding doesn't improve. Explained that some irregular bleeding is expected with Nexplanon and can be normal during postpartum period (up to 8 weeks). Pt would like medication to stop bleeding or would like Nexplanon removed because this is not an issue she had with Nexplanon previously. Explained first available appt for removal is July.  ?

## 2021-12-18 ENCOUNTER — Other Ambulatory Visit: Payer: Self-pay

## 2021-12-18 ENCOUNTER — Ambulatory Visit (INDEPENDENT_AMBULATORY_CARE_PROVIDER_SITE_OTHER): Payer: Medicaid Other | Admitting: Obstetrics and Gynecology

## 2021-12-18 ENCOUNTER — Encounter: Payer: Self-pay | Admitting: Obstetrics and Gynecology

## 2021-12-18 VITALS — BP 105/54 | HR 66 | Wt 214.1 lb

## 2021-12-18 DIAGNOSIS — Z3046 Encounter for surveillance of implantable subdermal contraceptive: Secondary | ICD-10-CM

## 2021-12-18 DIAGNOSIS — N921 Excessive and frequent menstruation with irregular cycle: Secondary | ICD-10-CM | POA: Diagnosis not present

## 2021-12-18 DIAGNOSIS — Z1331 Encounter for screening for depression: Secondary | ICD-10-CM

## 2021-12-18 DIAGNOSIS — Z975 Presence of (intrauterine) contraceptive device: Secondary | ICD-10-CM

## 2021-12-18 DIAGNOSIS — Z3009 Encounter for other general counseling and advice on contraception: Secondary | ICD-10-CM | POA: Diagnosis not present

## 2021-12-18 HISTORY — PX: REMOVAL OF IMPLANON ROD: OBO 1006

## 2021-12-18 NOTE — Progress Notes (Signed)
Obstetrics and Gynecology Visit Return Patient Evaluation  Appointment Date: 12/18/2021  Primary Care Provider: Pcp, No  OBGYN Clinic: Center for [facility]  Chief Complaint: AUB with nexplanon in place  History of Present Illness:  Brooke Robertson is a 31 y.o. place earlier this year immediately PP and she's had AUB ever since. She breastfed in the beginning but is now just formula feeding. Unsure about periods and she has some spotting almost every day. She never had this with her last nexplanon that was placed at her PP visit. No current bleeding. Last sex >2wks ago. She's used depo in the past but didn't like how it made her feel and it was hard for her to take OCPs consistently in the past.   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  Physical Exam:  BP (!) 105/54   Pulse 66   Wt 214 lb 1.6 oz (97.1 kg)   Breastfeeding No   BMI 41.81 kg/m  Body mass index is 41.81 kg/m. General appearance: Well nourished, well developed female in no acute distress.  Neuro/Psych:  Normal mood and affect.    See procedure note for nexplanon removal  Assessment: pt stable  Plan:  1. Positive depression screening - Ambulatory referral to Integrated Behavioral Health  2. Breakthrough bleeding on Nexplanon Offered to do a month or two of combined OCPs and w/u to see about AUB but she would like to remove and be hormone free to see if her aub goes away. - TSH Rfx on Abnormal to Free T4 - CBC - Beta hCG quant (ref lab)  3. Encounter for other general counseling or advice on contraception   RTC: 2-43m for  annual   Cornelia Copa MD Attending Center for [employer] Midwife)

## 2021-12-18 NOTE — Procedures (Signed)
Nexplanon Removal Procedure Note Prior to the procedure being performed, the patient (or guardian) was asked to state their full name, date of birth, type of procedure being performed and the exact location of the operative site. This information was then checked against the documentation in the patient's chart. Prior to the procedure being performed, a "time out" was performed by the physician that confirmed the correct patient, procedure and site.  After informed consent was obtained, the patient's left arm was examined and the Nexplanon rod was noted to be easily palpable. The area was cleaned with alcohol then local anesthesia was infiltrated with 3 ml of 1% lidocaine with epinephrine. The area was prepped with betadine. Using sterile technique, the Nexplanon device was brought to the incision site. The capsule was scrapped off with the scalpel, the Nexplanon grasped with hemostats, and easily removed; the removal site was hemostatic. The Nexplanon was inspected and noted to be intact.  A steri-strip and a pressure dressing was applied.  The patient tolerated the procedure well.  She chose to do condoms for birth control.   Cornelia Copa MD Attending Center for [employer] Midwife)

## 2021-12-19 LAB — CBC
Hematocrit: 42 % (ref 34.0–46.6)
Hemoglobin: 13.9 g/dL (ref 11.1–15.9)
MCH: 30.2 pg (ref 26.6–33.0)
MCHC: 33.1 g/dL (ref 31.5–35.7)
MCV: 91 fL (ref 79–97)
Platelets: 300 10*3/uL (ref 150–450)
RBC: 4.61 x10E6/uL (ref 3.77–5.28)
RDW: 13.1 % (ref 11.7–15.4)
WBC: 5.4 10*3/uL (ref 3.4–10.8)

## 2021-12-19 LAB — T4F: T4,Free (Direct): 1.24 ng/dL (ref 0.82–1.77)

## 2021-12-19 LAB — BETA HCG QUANT (REF LAB): hCG Quant: <1 m[IU]/mL

## 2021-12-19 LAB — TSH RFX ON ABNORMAL TO FREE T4: TSH: 0.449 u[IU]/mL — ABNORMAL LOW (ref 0.450–4.500)

## 2021-12-19 NOTE — BH Specialist Note (Signed)
Pt did not arrive to video visit and did not answer the phone; Left HIPPA-compliant message to call back Ameriah Lint from [facility] at  [phone] (Ehan Freas's office).  ?; left MyChart message for patient.  ? ?

## 2021-12-30 ENCOUNTER — Ambulatory Visit: Payer: Medicaid Other | Admitting: Clinical

## 2021-12-30 DIAGNOSIS — Z91199 Patient's noncompliance with other medical treatment and regimen due to unspecified reason: Secondary | ICD-10-CM

## 2022-01-05 IMAGING — US US OB COMP LESS 14 WK
1 series · 15 of 28 positions shown · non-contrast
Comparison: None.

CLINICAL DATA: Abdominal pain in 1st trimester pregnancy.

EXAM:
OBSTETRIC <14 WK ULTRASOUND
TECHNIQUE: Transabdominal ultrasound was performed for evaluation of the
gestation as well as the maternal uterus and adnexal regions.

[Series 1: us ob comp less 14 wk · 40 acquisitions, 15 frames shown]
[im 1/40]
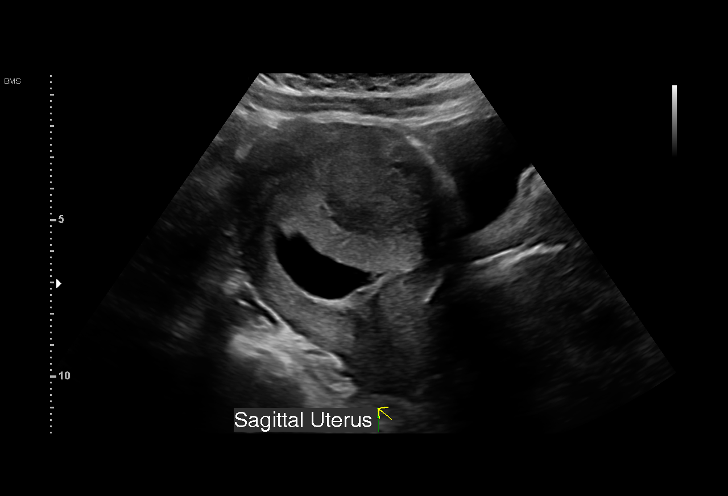
[im 3/40]
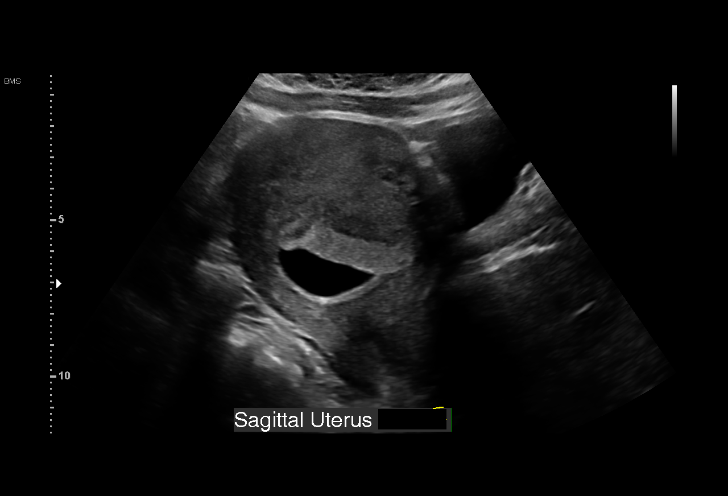
[im 6/40]
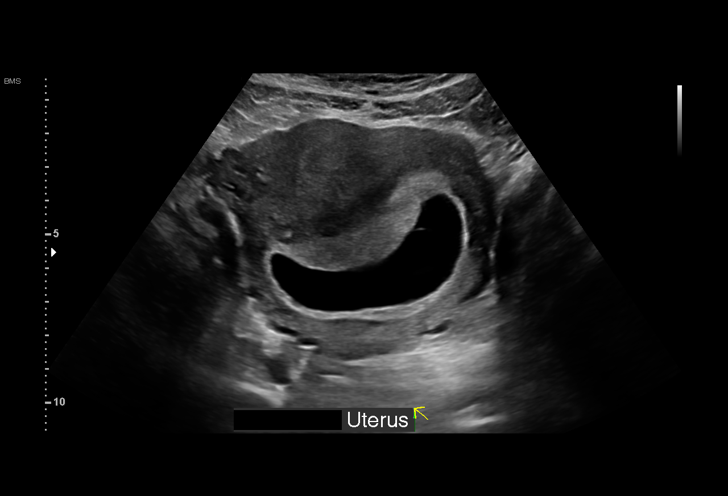
[im 9/40]
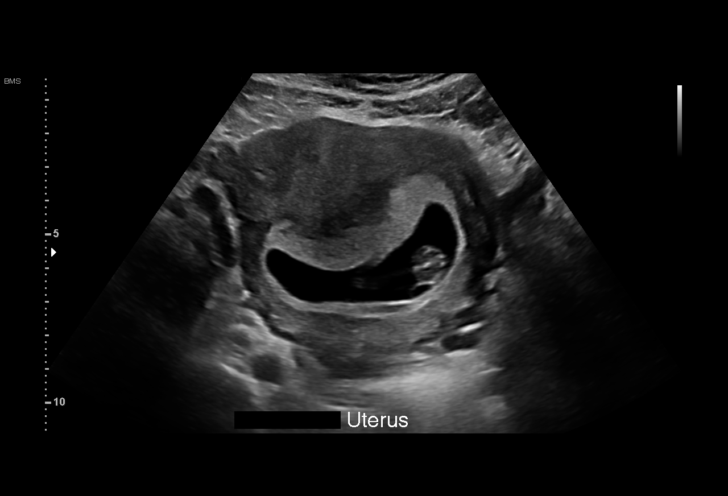
[im 12/40]
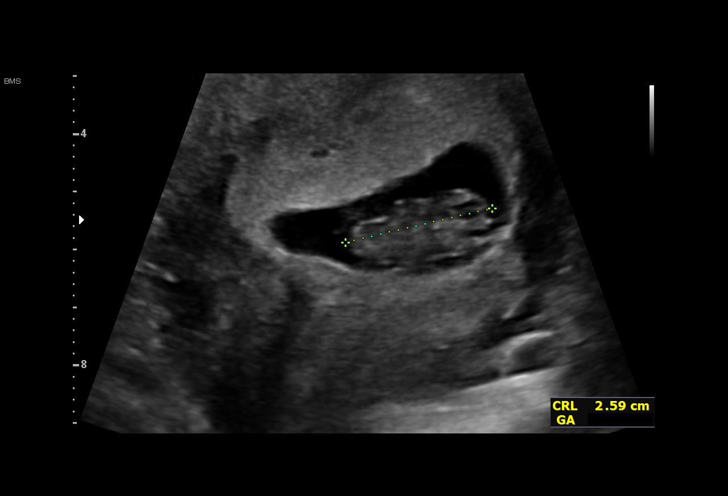
[im 15/40]
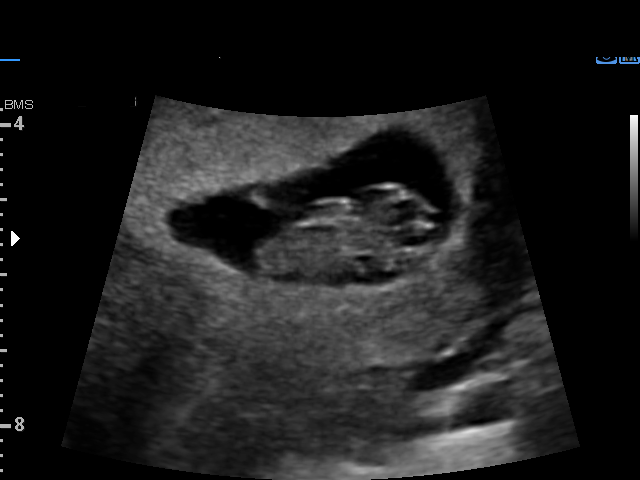
[im 18/40]
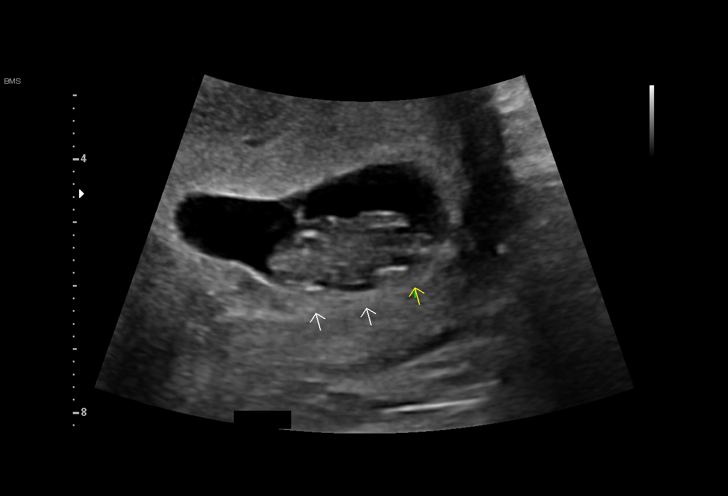
[im 21/40]
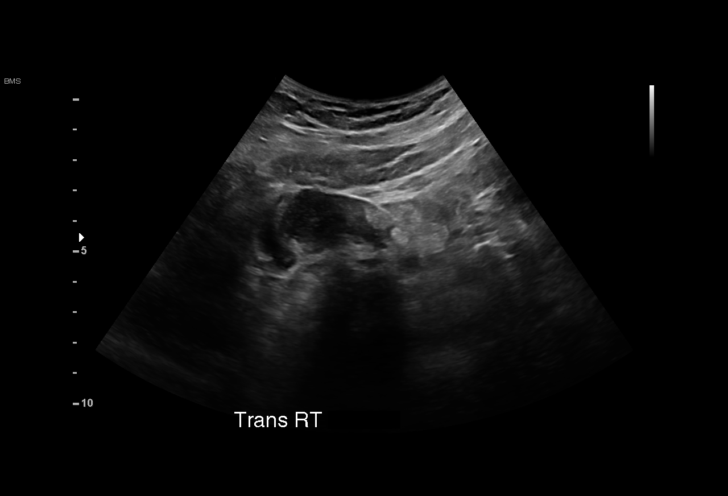
[im 22/40]
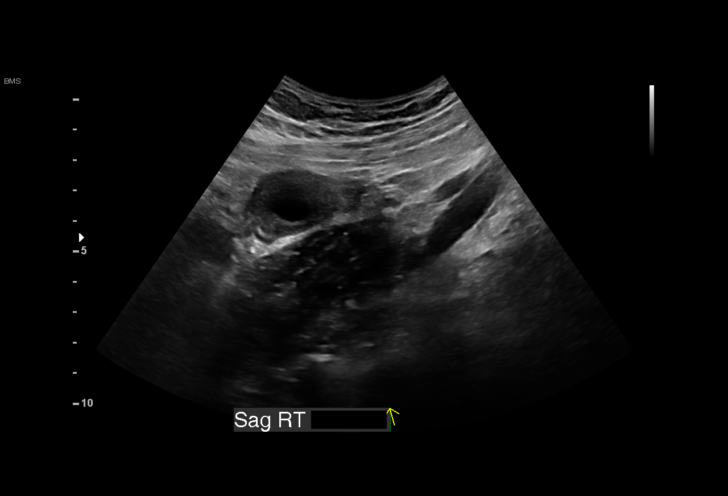
[im 25/40]
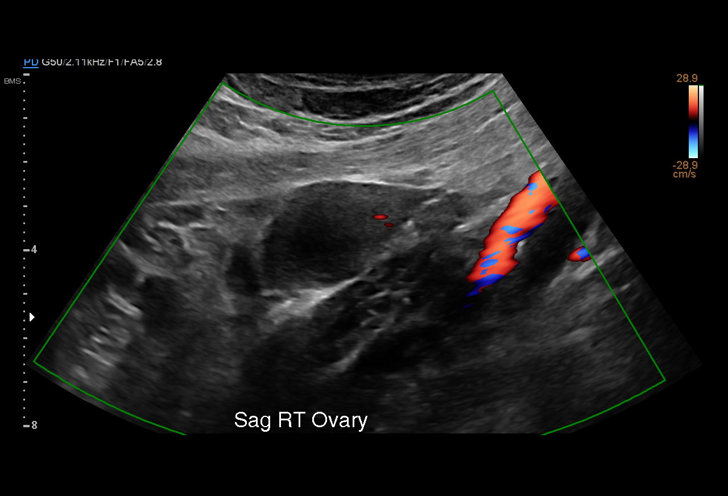
[im 28/40]
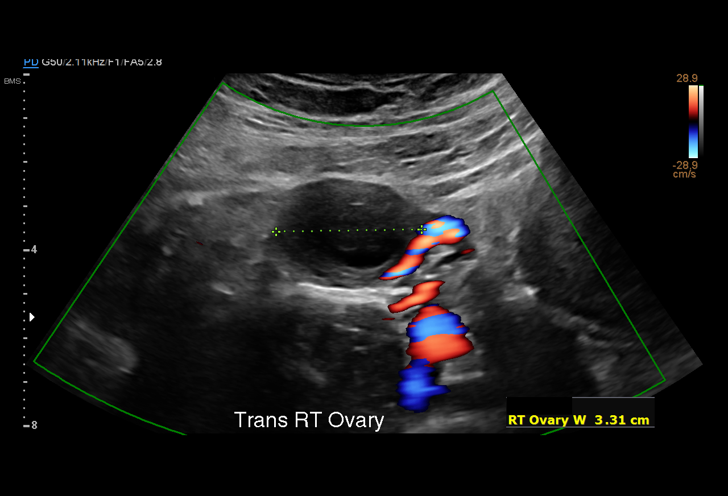
[im 31/40]
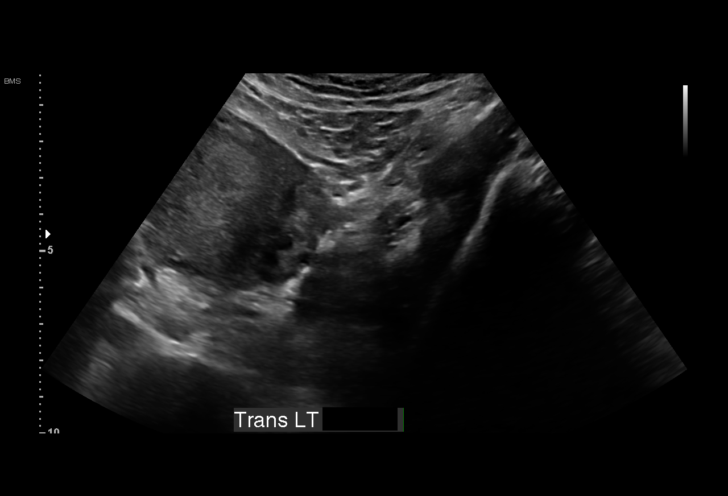
[im 34/40]
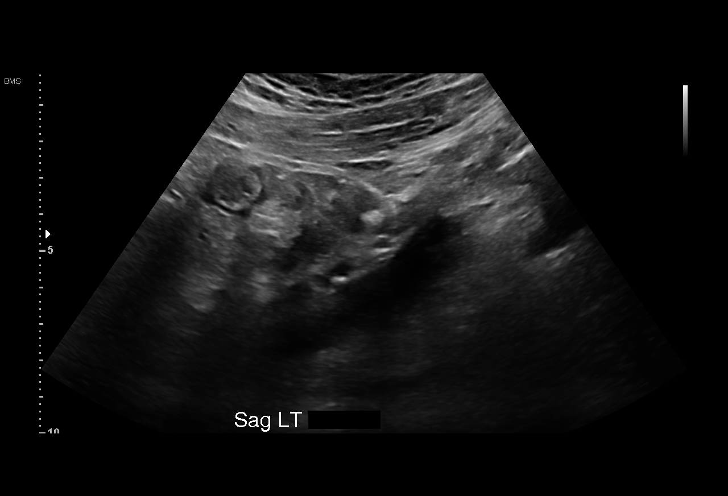
[im 37/40]
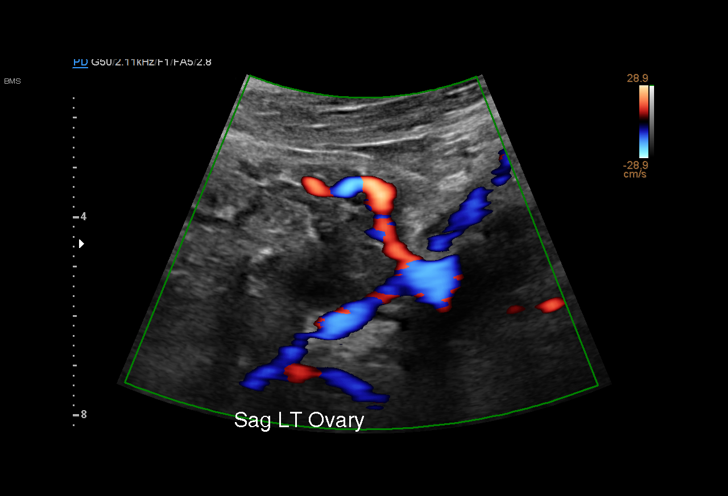
[im 40/40]
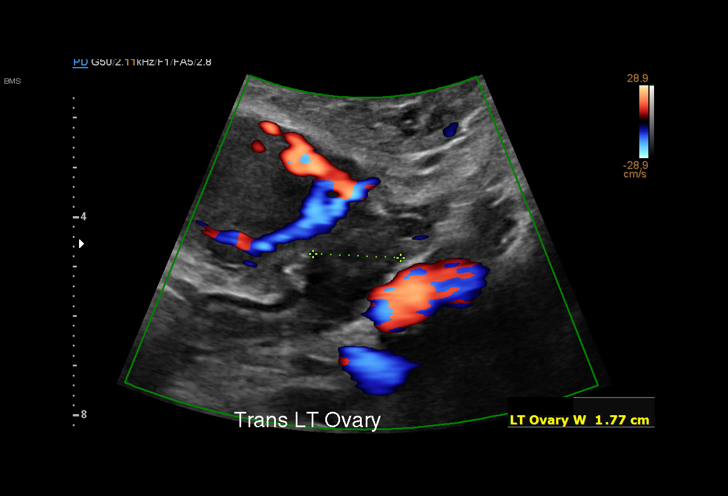

[15 of 28 positions shown; findings below may reference images not displayed]

FINDINGS: Intrauterine gestational sac: Single

Yolk sac:  Visualized.

Embryo:  Visualized.

Cardiac Activity: Visualized.

Heart Rate: 185 bpm

CRL:   26 mm   9 w 2 d                  US EDC: 08/18/2021

Subchorionic hemorrhage:  None visualized.

Maternal uterus/adnexae: Both ovaries are normal in appearance. No
mass or abnormal free fluid identified.
IMPRESSION: Single living IUP with estimated gestational age of 9 weeks 2 days,
and US EDC of 08/18/2021.

No maternal uterine or adnexal abnormality identified.

## 2022-01-11 LAB — GLUCOSE, POCT (MANUAL RESULT ENTRY): POC Glucose: 116 mg/dl — AB (ref 70–99)

## 2022-03-03 ENCOUNTER — Other Ambulatory Visit: Payer: Self-pay

## 2022-03-03 ENCOUNTER — Ambulatory Visit (INDEPENDENT_AMBULATORY_CARE_PROVIDER_SITE_OTHER): Payer: Medicaid Other | Admitting: Certified Nurse Midwife

## 2022-03-03 ENCOUNTER — Encounter: Payer: Self-pay | Admitting: Certified Nurse Midwife

## 2022-03-03 VITALS — BP 130/72 | HR 56 | Ht 60.0 in | Wt 212.7 lb

## 2022-03-03 DIAGNOSIS — F32A Depression, unspecified: Secondary | ICD-10-CM

## 2022-03-03 DIAGNOSIS — Z01419 Encounter for gynecological examination (general) (routine) without abnormal findings: Secondary | ICD-10-CM

## 2022-03-03 DIAGNOSIS — Z3009 Encounter for other general counseling and advice on contraception: Secondary | ICD-10-CM

## 2022-03-03 DIAGNOSIS — F419 Anxiety disorder, unspecified: Secondary | ICD-10-CM | POA: Diagnosis not present

## 2022-03-03 NOTE — BH Specialist Note (Deleted)
Integrated Behavioral Health via Telemedicine Visit  03/03/2022 Brooke Robertson [id number]  Number of [place] Clinician visits: No data recorded Session Start time: No data recorded  Session End time: No data recorded Total time in minutes: No data recorded  Referring Provider: Aletha Halim, MD Patient/Family location: Home*** [facility] Provider location: [facility] [place] [facility]  All persons participating in visit: Patient Brooke Robertson and [place]   Types of Service: {CHL AMB TYPE OF SERVICE:[id number]}  I connected with Corine Shelter and/or Roseanne Kaufman Bunn's  n/a  via  Telephone or Video Enabled Telemedicine Application  (Video is Caregility application) and verified that I am speaking with the correct person using two identifiers. Discussed confidentiality: Yes   I discussed the limitations of telemedicine and the availability of in person appointments.  Discussed there is a possibility of technology failure and discussed alternative modes of communication if that failure occurs.  I discussed that engaging in this telemedicine visit, they consent to the provision of behavioral healthcare and the services will be billed under their insurance.  Patient and/or legal guardian expressed understanding and consented to Telemedicine visit: Yes   Presenting Concerns: Patient and/or family reports the following symptoms/concerns: *** Duration of problem: ***; Severity of problem: {Mild/Moderate/Severe:20260}  Patient and/or Family's Strengths/Protective Factors: {CHL AMB BH PROTECTIVE FACTORS:[phone]}  Goals Addressed: Patient will:  Reduce symptoms of: {IBH Symptoms:21014056}   Increase knowledge and/or ability of: {IBH Patient Tools:21014057}   Demonstrate ability to: {IBH Goals:21014053}  Progress towards Goals: {CHL AMB BH PROGRESS TOWARDS GOALS:[phone]}  Interventions: Interventions utilized:  {IBH  Interventions:21014054} Standardized Assessments completed: {IBH Screening Tools:21014051}  Patient and/or Family Response: ***  Assessment: Patient currently experiencing ***.   Patient may benefit from ***.  Plan: Follow up with behavioral health clinician on : *** Behavioral recommendations: *** Referral(s): {IBH Referrals:21014055}  I discussed the assessment and treatment plan with the patient and/or parent/guardian. They were provided an opportunity to ask questions and all were answered. They agreed with the plan and demonstrated an understanding of the instructions.   They were advised to call back or seek an in-person evaluation if the symptoms worsen or if the condition fails to improve as anticipated.  Caroleen Hamman Shuaib Corsino, LCSW

## 2022-03-04 NOTE — Progress Notes (Signed)
GYNECOLOGY OFFICE VISIT NOTE  History:   Brooke Robertson is a 31 y.o. [id number] here today for a GYN visit for contraception counseling. She denies any abnormal vaginal discharge, bleeding, pelvic pain or other concerns.    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 06/25/2021   On Panorama testing   Obesity    Preeclampsia    Trichomonal vaginitis     Past Surgical History:  Procedure Laterality Date   CESAREAN SECTION     CESAREAN SECTION N/A 08/11/2021   Procedure: CESAREAN SECTION;  Surgeon: Tereso Newcomer, MD;  Location: [facility] LD ORS;  Service: Obstetrics;  Laterality: N/A;   REMOVAL OF IMPLANON ROD  12/18/2021   TONSILLECTOMY      The following portions of the patient's history were reviewed and updated as appropriate: allergies, current medications, past family history, past medical history, past social history, past surgical history and problem list.   Health Maintenance:  Normal pap and negative HRHPV on 07/08/21.  Normal mammogram not indicated.   Review of Systems:  Pertinent items noted in HPI and remainder of comprehensive ROS otherwise negative.  Physical Exam:  BP 130/72   Pulse (!) 56   Ht 5' (1.524 m)   Wt 212 lb 11.2 oz (96.5 kg)   LMP 01/31/2022 (Exact Date)   Breastfeeding No   BMI 41.54 kg/m  CONSTITUTIONAL: Well-developed, well-nourished female in no acute distress.  HEENT:  Normocephalic, atraumatic. External right and left ear normal. No scleral icterus.  NECK: Normal range of motion, supple, no masses noted on observation SKIN: No rash noted. Not diaphoretic. No erythema. No pallor. MUSCULOSKELETAL: Normal range of motion. No edema noted. NEUROLOGIC: Alert and oriented to person, place, and time. Normal muscle tone  coordination. No cranial nerve deficit noted. PSYCHIATRIC: Normal mood and affect. Normal behavior. Normal judgment and thought content. CARDIOVASCULAR: Normal heart rate noted RESPIRATORY: Effort and breath sounds normal, no problems with respiration noted ABDOMEN: No masses noted. No other overt distention noted.   PELVIC: Deferred  Labs and Imaging No results found for this or any previous visit (from the past 168 hour(s)). No results found.    Assessment and Plan:    1. Encounter for well woman exam - Patient doing well. Does not desire pregnancy within the next year.   2. Birth control counseling - Recent nexplanon removal. Patient was experiencing some breakthrough bleeding and requesting removal.  - Education provided on Nexplanon and normal bleeding patterns when first placed.  - Patient desires the hormonal patch for [facility]. Reviewed risks and benefits for patch and reviewed bleeding expectations that may be associated with the patch.  - Admin instructions provided at patient bedside.  - Rx for Contraceptive patch sent to outpatient pharmacy.   3. Anxiety and Depression  - Referral to IBH placed today.   Routine preventative health maintenance measures emphasized. Please refer to After Visit Summary for other counseling recommendations.   Return in about 1 year (around 03/04/2023) for [place].    I spent 30 minutes dedicated to the care of this patient including pre-visit review of records, face to face time with the patient discussing her conditions and treatments and post visit orders.   Deisy Ozbun (Danella Deis) Suzie Portela, MSN, CNM  [facility]  03/04/22 1:34 PM

## 2022-03-10 NOTE — BH Specialist Note (Deleted)
Pt did not arrive to video visit and did not answer the phone; Unable to leave voice message; left MyChart message for patient.   

## 2022-03-13 ENCOUNTER — Encounter: Payer: Medicaid Other | Admitting: Clinical

## 2022-04-15 ENCOUNTER — Ambulatory Visit: Payer: Medicaid Other | Admitting: Obstetrics and Gynecology

## 2022-05-10 IMAGING — US US MFM MCA DOPPLER
1 series · 13 of 28 positions shown · non-contrast
Comparison: none

[Series 1: us mfm mca doppler · 13 of 44 slices shown]
[im 2/44]
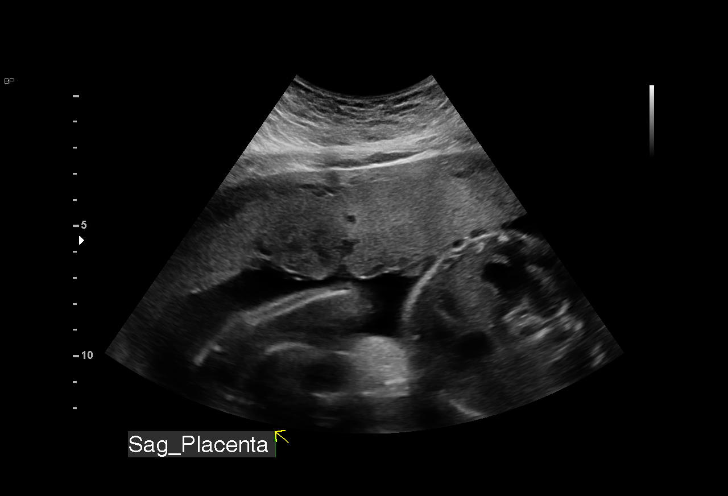
[im 5/44]
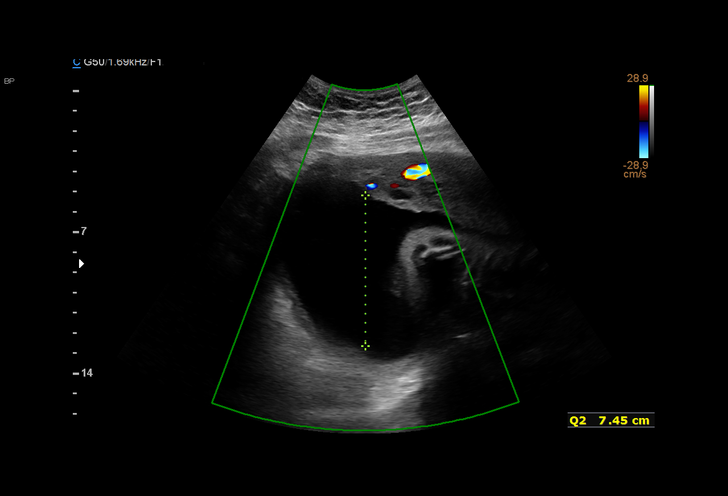
[im 8/44]
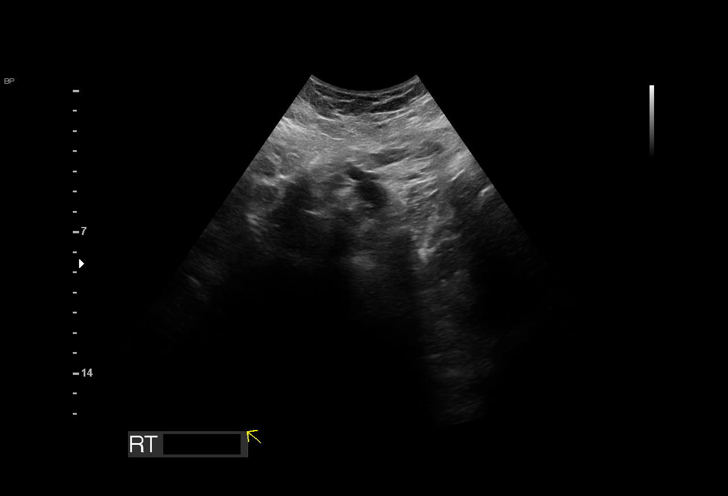
[im 12/44]
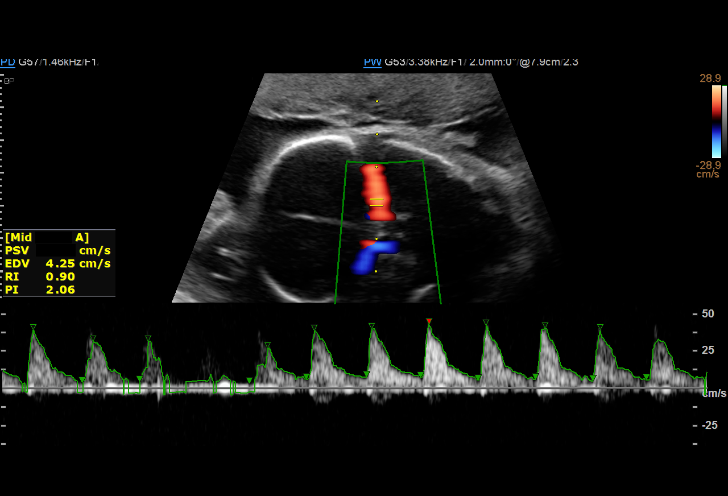
[im 15/44]
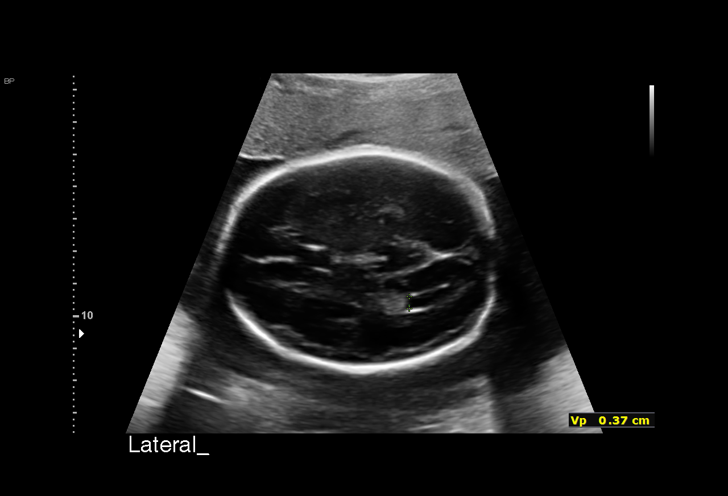
[im 18/44]
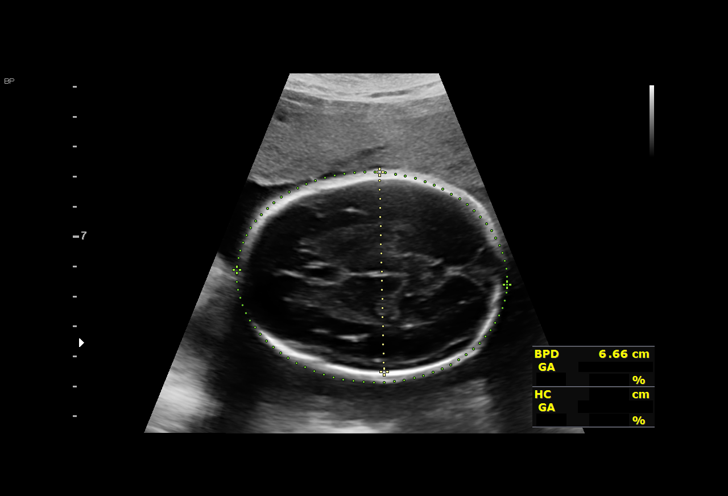
[im 23/44]
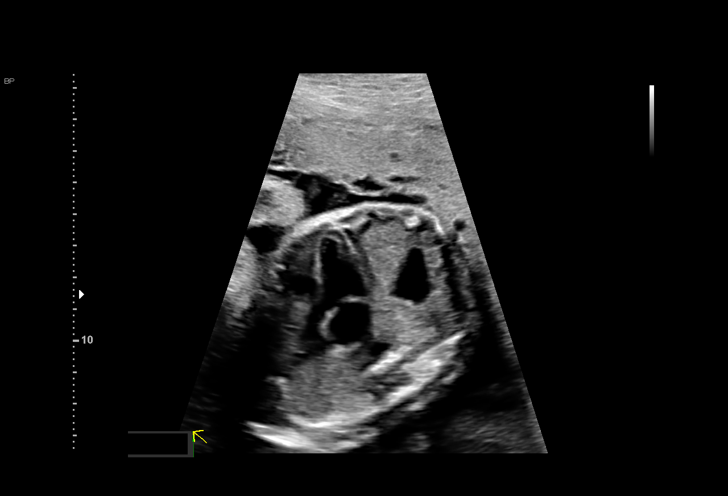
[im 26/44]
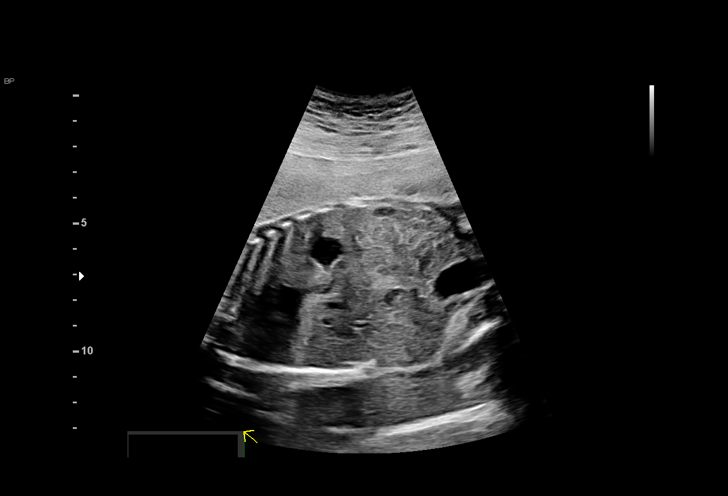
[im 29/44]
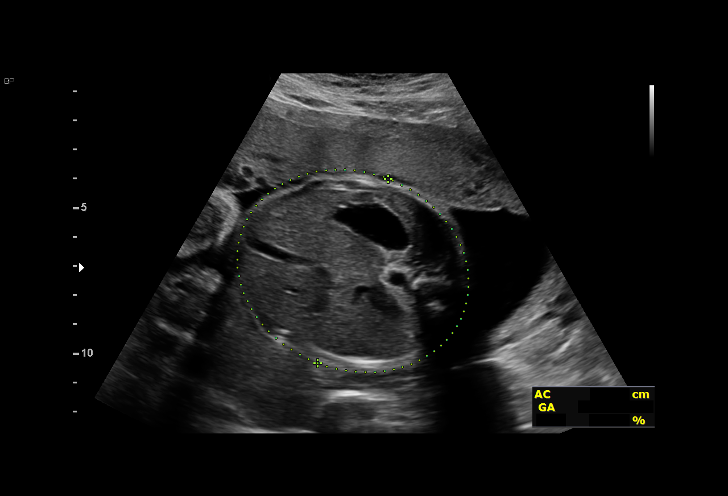
[im 32/44]
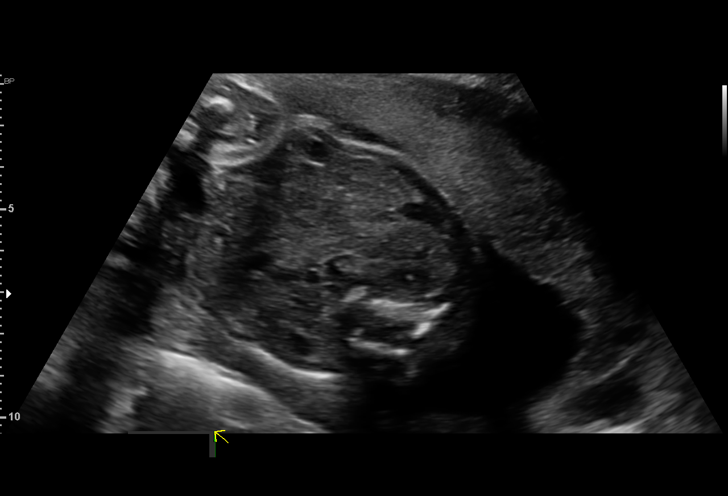
[im 36/44]
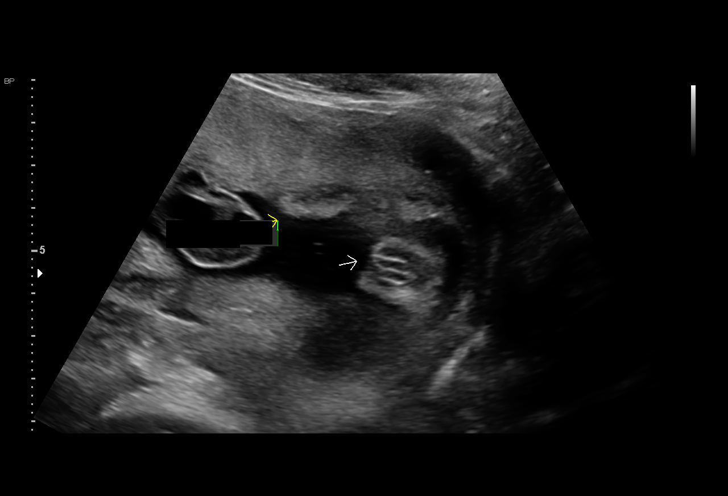
[im 39/44]
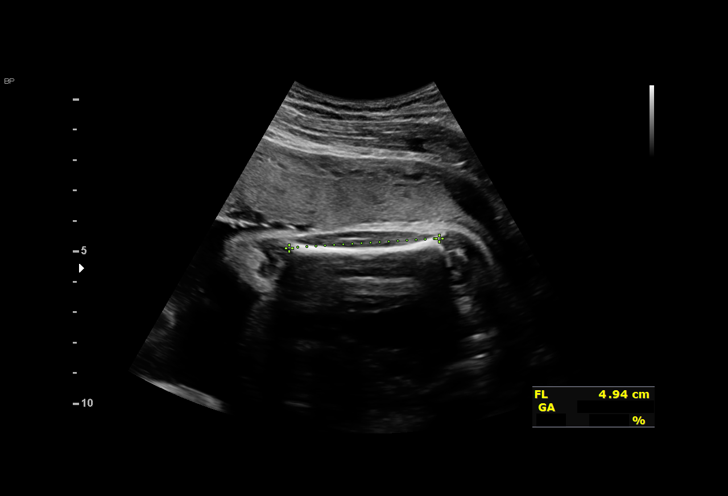
[im 42/44]
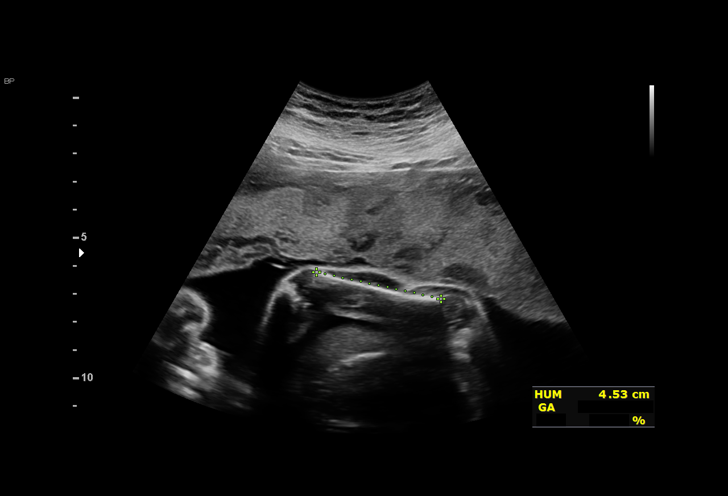

[13 of 28 positions shown; findings below may reference images not displayed]

[REDACTED]care
                   WALKER CNM

Indications

 Isoimmunization - Other (Anti-M)
 Obesity complicating pregnancy, second
 trimester (BMI 34)
 Poor obstetric history: Previous
 preeclampsia / eclampsia/gestational HTN
 Poor obstetric history: Previous gestational
 diabetes
 History of cesarean delivery, currently
 pregnant
 27 weeks gestation of pregnancy
 LR NIPS
Fetal Evaluation

 Num Of Fetuses:         1
 Fetal Heart Rate(bpm):  152
 Cardiac Activity:       Observed
 Presentation:           Breech
 Placenta:               Anterior
 P. Cord Insertion:      Previously Visualized

 Amniotic Fluid
 AFI FV:      Within normal limits

 AFI Sum(cm)     %Tile       Largest Pocket(cm)
 20.43           83
 RUQ(cm)       RLQ(cm)       LUQ(cm)        LLQ(cm)

Biometry

 BPD:      66.8  mm     G. Age:  26w 6d         31  %    CI:        70.99   %    70 - 86
                                                         FL/HC:      19.6   %    18.6 -
 HC:      252.6  mm     G. Age:  27w 3d         31  %    HC/AC:      1.08        1.05 -
 AC:       234   mm     G. Age:  27w 5d         60  %    FL/BPD:     74.0   %    71 - 87
 FL:       49.4  mm     G. Age:  26w 5d         22  %    FL/AC:      21.1   %    20 - 24
 HUM:      45.1  mm     G. Age:  26w 5d         37  %
 CER:      32.7  mm     G. Age:  27w 6d         86  %
 LV:        3.7  mm
 CM:        6.7  mm

 Est. FW:    8891  gm      2 lb 5 oz     43  %
OB History

 Gravidity:    3         Term:   1         SAB:   1
Gestational Age

 U/S Today:     27w 1d                                        EDD:   08/18/21
 Best:          27w 1d     Det. By:  Early Ultrasound         EDD:   08/18/21
Anatomy

 Cranium:               Appears normal         LVOT:                   Appears normal
 Cavum:                 Previously seen        Aortic Arch:            Previously seen
 Ventricles:            Previously seen        Ductal Arch:            Previously seen
 Choroid Plexus:        Previously seen        Diaphragm:              Appears normal
 Cerebellum:            Appears normal         Stomach:                Appears normal, left
                                                                       sided
 Posterior Fossa:       Appears normal         Abdomen:                Appears normal
 Nuchal Fold:           Not applicable (>20    Abdominal Wall:         Previously seen
                        wks GA)
 Face:                  Orbits and profile     Cord Vessels:           Appears normal (3
                        previously seen                                vessel cord)
 Lips:                  Previously seen        Kidneys:                Appear normal
 Palate:                Previously seen        Bladder:                Appears normal
 Thoracic:              Previously seen        Spine:                  Previously seen
 Heart:                 Appears normal         Upper Extremities:      Previously seen
                        (4CH, axis, and
                        situs)
 RVOT:                  Appears normal         Lower Extremities:      Previously seen

 Other:  Female gender previously seen. Heels and 5th digit, VC, 3VV, 3VTV,
         nasal bone and lenses previously visualized.
Doppler - Fetal Vessels

 Middle Cerebral Artery
  S/D                                 PI    %tile     PSV   MoM
                                                    (cm/s)
  5.67                              1.[REDACTED]

Cervix Uterus Adnexa
 Cervix
 Not visualized (advanced GA >81wks)

 Uterus
 Normal shape and size.

 Right Ovary
 Not visualized.

 Left Ovary
 Within normal limits.

 Cul De Sac
 No free fluid seen.

 Adnexa
 No adnexal mass visualized.
Comments

 This patient was seen for a follow up growth scan and MCA
 Doppler study as she has screened positive for the anti-M
 antibody.  She denies any problems since her last exam.
 She was informed that the fetal growth and amniotic fluid
 level appears appropriate for her gestational age.
 The peak systolic velocity of the middle cerebral artery was
 less than 1.5 multiple of the median for her gestational age,
 indicating that her fetus is not anemic at this time.
 There were no signs of fetal hydrops noted on today's exam.
 The patient was reassured by today's findings.
 A follow-up growth scan and MCA Doppler study was
 scheduled in 6 weeks.

## 2022-06-03 ENCOUNTER — Ambulatory Visit: Payer: Medicaid Other | Admitting: Family Medicine

## 2022-10-09 DIAGNOSIS — Z131 Encounter for screening for diabetes mellitus: Secondary | ICD-10-CM

## 2022-10-09 LAB — GLUCOSE, POCT (MANUAL RESULT ENTRY): POC Glucose: 105 mg/dl — AB (ref 70–99)

## 2022-10-13 NOTE — Congregational Nurse Program (Unsigned)
Seen at [facility].  Health screening done, counseling done.  Will followup with blood pressure and blood sugar.  Juliann Pulse, RN, Congregational Nursing, [phone]

## 2022-10-18 NOTE — Congregational Nurse Program (Signed)
Discussed her results of health screening,  Given gift card for attending.  Needs PCP, has Medicaid. Return to me in 2 weeks, for BP check.  Juliann Pulse, RN, Congregational Nurse, [phone].

## 2022-10-19 NOTE — Congregational Nurse Program (Signed)
New member.  Brief encounter at [facility].  Explained CN role.  Offered assistance as needed.   Juliann Pulse, RN, Congregational Nurse, [phone].

## 2022-11-07 NOTE — Congregational Nurse Program (Signed)
Member attended "Love your brain" class.  She was alert, participating, added to discussion.  Some discussion centered around smoking, health concerns.  Member felt supported here with staff.  Juliann Pulse, RN  Congregational Nursing  [phone]

## 2023-04-08 ENCOUNTER — Emergency Department (HOSPITAL_COMMUNITY)
Admission: EM | Admit: 2023-04-08 | Discharge: 2023-04-08 | Disposition: A | Discharge status: Home / Self Care | Payer: 59 | Attending: Emergency Medicine | Admitting: Emergency Medicine

## 2023-04-08 ENCOUNTER — Encounter (HOSPITAL_COMMUNITY): Payer: Self-pay

## 2023-04-08 DIAGNOSIS — B349 Viral infection, unspecified: Secondary | ICD-10-CM | POA: Insufficient documentation

## 2023-04-08 DIAGNOSIS — R519 Headache, unspecified: Secondary | ICD-10-CM | POA: Diagnosis present

## 2023-04-08 DIAGNOSIS — Z20822 Contact with and (suspected) exposure to covid-19: Secondary | ICD-10-CM | POA: Insufficient documentation

## 2023-04-08 LAB — POC URINE PREG, ED: Preg Test, Ur: NEGATIVE

## 2023-04-08 LAB — RESP PANEL BY RT-PCR (RSV, FLU A&B, COVID)  RVPGX2
Influenza A by PCR: NEGATIVE
Influenza B by PCR: NEGATIVE
Resp Syncytial Virus by PCR: NEGATIVE
SARS Coronavirus 2 by RT PCR: NEGATIVE

## 2023-04-08 MED ORDER — PROCHLORPERAZINE EDISYLATE 10 MG/2ML IJ SOLN
10.0000 mg | Freq: Once | INTRAMUSCULAR | Status: AC
  Administered 2023-04-08: 10 mg via INTRAVENOUS
  Filled 2023-04-08: qty 2

## 2023-04-08 MED ORDER — ACETAMINOPHEN 500 MG PO TABS: 1000.0000 mg | ORAL_TABLET | Freq: Three times a day (TID) | ORAL | 0 refills | Status: AC | PRN

## 2023-04-08 MED ORDER — IBUPROFEN 600 MG PO TABS: 600.0000 mg | ORAL_TABLET | Freq: Four times a day (QID) | ORAL | 0 refills | Status: AC | PRN

## 2023-04-08 MED ORDER — DIPHENHYDRAMINE HCL 50 MG/ML IJ SOLN
12.5000 mg | Freq: Once | INTRAMUSCULAR | Status: AC
  Administered 2023-04-08: 12.5 mg via INTRAVENOUS
  Filled 2023-04-08: qty 1

## 2023-04-08 NOTE — ED Provider Notes (Addendum)
[place] EMERGENCY DEPARTMENT AT [facility] Provider Note   CSN: [id number] Arrival date & time: 04/08/23  4098    History  Chief Complaint  Patient presents with   Headache    Brooke Robertson is a 32 y.o. female here for headache. Started 5 days ago.  Associated congestion, rhinorrhea, cough.  No sudden onset thunderclap headache.  No fever, neck pain, vision changes, numbness, weakness, chest pain, shortness of breath.  No medication PTA.  Feels like prior Flu that she has had previously. Unsure if she could be pregnant. No abd pain, emesis.  HPI     Home Medications Prior to Admission medications   Medication Sig Start Date End Date Taking? Authorizing Provider  acetaminophen (TYLENOL) 500 MG tablet Take 2 tablets (1,000 mg total) by mouth every 8 (eight) hours as needed (pain). 04/08/23   Steed Kanaan A, PA-C  ibuprofen (ADVIL) 600 MG tablet Take 1 tablet (600 mg total) by mouth every 6 (six) hours as needed (pain). 04/08/23   Jamaal Bernasconi A, PA-C      Allergies    Patient has no known allergies.    Review of Systems   Review of Systems  Constitutional: Negative.   HENT:  Positive for congestion and rhinorrhea. Negative for dental problem, drooling, ear discharge, ear pain, facial swelling, hearing loss, mouth sores, nosebleeds, postnasal drip, sinus pressure, sinus pain, sneezing, sore throat, tinnitus, trouble swallowing and voice change.   Respiratory:  Positive for cough. Negative for chest tightness.   Cardiovascular: Negative.   Gastrointestinal: Negative.   Genitourinary: Negative.   Musculoskeletal: Negative.   Skin: Negative.   Neurological:  Positive for headaches.  All other systems reviewed and are negative.   Physical Exam Updated Vital Signs BP (!) 116/91   Pulse 81   Temp 98.1 F (36.7 C) (Oral)   Resp 16   SpO2 100%  Physical Exam Physical Exam  Constitutional: Pt is oriented to person, place, and time. Pt appears  well-developed and well-nourished. No distress.  HENT:  Head: Normocephalic and atraumatic.  Nose: congestion, rhinorrhea Mouth/Throat: Oropharynx is clear and moist.  Eyes: Conjunctivae and EOM are normal. PERRLA No horizontal, vertical or rotational nystagmus  Neck: Normal range of motion. Neck supple.  Full active and passive ROM without pain No midline or paraspinal tenderness No nuchal rigidity or meningeal signs  Cardiovascular: Normal rate, regular rhythm and intact distal pulses.   Pulmonary/Chest: Effort normal and breath sounds normal. No respiratory distress. Pt has no wheezes. No rales.  Abdominal: Soft. Bowel sounds are normal. There is no tenderness. There is no rebound and no guarding.  Musculoskeletal: Normal range of motion.  Lymphadenopathy:    No cervical adenopathy.  Neurological: Pt. is alert and oriented to person, place, and time. He has normal reflexes. No cranial nerve deficit.  Exhibits normal muscle tone. Coordination normal.  Mental Status:  Alert, oriented, thought content appropriate. Speech fluent without evidence of aphasia. Able to follow 2 step commands without difficulty.  Cranial Nerves:  2-12 grossly intact Motor:  Equal strength BIL Sensory: intact sensation CV: distal pulses palpable throughout   Skin: Skin is warm and dry. No rash noted. Pt is not diaphoretic.  Psychiatric: Pt has a normal mood and affect. Behavior is normal. Judgment and thought content normal.  Nursing note and vitals reviewed.  ED Results / Procedures / Treatments   Labs (all labs ordered are listed, but only abnormal results are displayed) Labs Reviewed  RESP PANEL  BY RT-PCR (RSV, FLU A&B, COVID)  [id number]  POC URINE PREG, ED    EKG None  Radiology No results found.  Procedures Procedures    Medications Ordered in ED Medications  prochlorperazine (COMPAZINE) injection 10 mg (10 mg Intravenous Given 04/08/23 2005)  diphenhydrAMINE (BENADRYL) injection 12.5 mg  (12.5 mg Intravenous Given 04/08/23 2005)   ED Course/ Medical Decision Making/ A&P   32 year old here for evaluation of congestion and URI symptoms.  Headache over the last 5 days.  Described as a banding across her head.  Associated congestion, rhinorrhea and cough.  No fever.  No neck stiffness, neck rigidity.  She has no meningismus, low suspicion for meningitis.  She has a nonfocal neuroexam without deficits.  No recent trauma or injury.  Low suspicion for bleed, IIH, dural venous sinus thrombosis.  Plan for migraine cocktail, viral panel.  Labs personally viewed and interpreted:  Viral panel neg Preg neg  Patient reassessed. HA improved. Suspect she likely has HA from viral UR sx. Will have her FU outpatient. Return for new or worsening symptoms. Do not thing she needs additional labs, imaging at this time.  The patient has been appropriately medically screened and/or stabilized in the ED. I have low suspicion for any other emergent medical condition which would require further screening, evaluation or treatment in the ED or require inpatient management.  Patient is hemodynamically stable and in no acute distress.  Patient able to ambulate in department prior to ED.  Evaluation does not show acute pathology that would require ongoing or additional emergent interventions while in the emergency department or further inpatient treatment.  I have discussed the diagnosis with the patient and answered all questions.  Pain is been managed while in the emergency department and patient has no further complaints prior to discharge.  Patient is comfortable with plan discussed in room and is stable for discharge at this time.  I have discussed strict return precautions for returning to the emergency department.  Patient was encouraged to follow-up with PCP/specialist refer to at discharge.                                  Medical Decision Making Amount and/or Complexity of Data Reviewed Independent  Historian: EMS External Data Reviewed: labs and notes.  Risk OTC drugs. Prescription drug management. Parenteral controlled substances. Decision regarding hospitalization. Diagnosis or treatment significantly limited by social determinants of health.     Final Clinical Impression(s) / ED Diagnoses Final diagnoses:  Acute nonintractable headache, unspecified headache type  Viral infection    Rx / DC Orders ED Discharge Orders          Ordered    acetaminophen (TYLENOL) 500 MG tablet  Every 8 hours PRN        04/08/23 2227    ibuprofen (ADVIL) 600 MG tablet  Every 6 hours PRN        04/08/23 2228                  Paytan Recine A, PA-C 04/08/23 2236    Pollyann Savoy, MD 04/08/23 2243

## 2023-04-08 NOTE — Discharge Instructions (Addendum)
Make sure to follow up outpatient.  Return for new or worsening symptoms  I have written for a few medications to help with your symptoms

## 2023-04-08 NOTE — ED Triage Notes (Signed)
Pt arrived via EMS from home w/ c/o headache x 1 week and episodes of light headiness. Pt endorses photo sensitivity. Pt has hx of anxiety.   BP 110/68 SpO2 100 RR 18 HR 98

## 2024-01-21 ENCOUNTER — Ambulatory Visit

## 2024-01-21 ENCOUNTER — Telehealth: Payer: Self-pay

## 2024-01-21 NOTE — Telephone Encounter (Signed)
 Called pt to reschedule missed appt; could not reach or leave vm
